# Patient Record
Sex: Female | Born: 1955 | Race: White | Hispanic: No | Marital: Married | Smoking: Never smoker
Health system: Southern US, Community
[De-identification: ages and names within clinical notes are randomized; demographics above are authoritative.]

## PROBLEM LIST (undated history)

## (undated) DIAGNOSIS — I10 Essential (primary) hypertension: Secondary | ICD-10-CM

---

## 2018-02-16 ENCOUNTER — Emergency Department (HOSPITAL_COMMUNITY): Payer: BLUE CROSS/BLUE SHIELD

## 2018-02-16 ENCOUNTER — Encounter (HOSPITAL_COMMUNITY): Payer: Self-pay | Admitting: Emergency Medicine

## 2018-02-16 ENCOUNTER — Inpatient Hospital Stay (HOSPITAL_COMMUNITY)
Admission: EM | Admit: 2018-02-16 | Discharge: 2018-02-22 | DRG: 354 | Disposition: A | Discharge status: Home / Self Care | Payer: BLUE CROSS/BLUE SHIELD | Attending: General Surgery | Admitting: General Surgery

## 2018-02-16 DIAGNOSIS — K439 Ventral hernia without obstruction or gangrene: Secondary | ICD-10-CM | POA: Diagnosis not present

## 2018-02-16 DIAGNOSIS — K460 Unspecified abdominal hernia with obstruction, without gangrene: Secondary | ICD-10-CM

## 2018-02-16 DIAGNOSIS — I864 Gastric varices: Secondary | ICD-10-CM | POA: Diagnosis present

## 2018-02-16 DIAGNOSIS — F419 Anxiety disorder, unspecified: Secondary | ICD-10-CM | POA: Diagnosis present

## 2018-02-16 DIAGNOSIS — K529 Noninfective gastroenteritis and colitis, unspecified: Secondary | ICD-10-CM | POA: Diagnosis present

## 2018-02-16 DIAGNOSIS — I8500 Esophageal varices without bleeding: Secondary | ICD-10-CM

## 2018-02-16 DIAGNOSIS — R112 Nausea with vomiting, unspecified: Secondary | ICD-10-CM | POA: Diagnosis not present

## 2018-02-16 DIAGNOSIS — K5900 Constipation, unspecified: Secondary | ICD-10-CM | POA: Diagnosis present

## 2018-02-16 DIAGNOSIS — R109 Unspecified abdominal pain: Secondary | ICD-10-CM

## 2018-02-16 DIAGNOSIS — Z9889 Other specified postprocedural states: Secondary | ICD-10-CM

## 2018-02-16 DIAGNOSIS — F909 Attention-deficit hyperactivity disorder, unspecified type: Secondary | ICD-10-CM | POA: Diagnosis present

## 2018-02-16 DIAGNOSIS — I10 Essential (primary) hypertension: Secondary | ICD-10-CM | POA: Diagnosis present

## 2018-02-16 DIAGNOSIS — R188 Other ascites: Secondary | ICD-10-CM | POA: Diagnosis present

## 2018-02-16 DIAGNOSIS — K7460 Unspecified cirrhosis of liver: Secondary | ICD-10-CM | POA: Diagnosis present

## 2018-02-16 DIAGNOSIS — Z8719 Personal history of other diseases of the digestive system: Secondary | ICD-10-CM

## 2018-02-16 DIAGNOSIS — R011 Cardiac murmur, unspecified: Secondary | ICD-10-CM | POA: Diagnosis present

## 2018-02-16 HISTORY — DX: Essential (primary) hypertension: I10

## 2018-02-16 LAB — COMPREHENSIVE METABOLIC PANEL
ALBUMIN: 4 g/dL (ref 3.5–5.0)
ALT: 25 U/L (ref 14–54)
AST: 35 U/L (ref 15–41)
Alkaline Phosphatase: 126 U/L (ref 38–126)
Anion gap: 12 (ref 5–15)
BUN: 11 mg/dL (ref 6–20)
CHLORIDE: 97 mmol/L — AB (ref 101–111)
CO2: 26 mmol/L (ref 22–32)
CREATININE: 0.71 mg/dL (ref 0.44–1.00)
Calcium: 9.6 mg/dL (ref 8.9–10.3)
GFR calc Af Amer: >60 mL/min (ref >60)
GLUCOSE: 126 mg/dL — AB (ref 65–99)
Potassium: 3.9 mmol/L (ref 3.5–5.1)
Sodium: 135 mmol/L (ref 135–145)
Total Bilirubin: 1.6 mg/dL — ABNORMAL HIGH (ref 0.3–1.2)
Total Protein: 7.9 g/dL (ref 6.5–8.1)

## 2018-02-16 LAB — LIPASE, BLOOD: LIPASE: 31 U/L (ref 11–51)

## 2018-02-16 LAB — URINALYSIS, ROUTINE W REFLEX MICROSCOPIC
GLUCOSE, UA: NEGATIVE mg/dL
HGB URINE DIPSTICK: NEGATIVE
KETONES UR: NEGATIVE mg/dL
LEUKOCYTES UA: NEGATIVE
Nitrite: NEGATIVE
Protein, ur: NEGATIVE mg/dL
Specific Gravity, Urine: 1.027 (ref 1.005–1.030)
pH: 6 (ref 5.0–8.0)

## 2018-02-16 LAB — CBC
HEMATOCRIT: 40.5 % (ref 36.0–46.0)
Hemoglobin: 13.8 g/dL (ref 12.0–15.0)
MCH: 31.4 pg (ref 26.0–34.0)
MCHC: 34.1 g/dL (ref 30.0–36.0)
MCV: 92 fL (ref 78.0–100.0)
PLATELETS: 126 10*3/uL — AB (ref 150–400)
RBC: 4.4 MIL/uL (ref 3.87–5.11)
RDW: 14.1 % (ref 11.5–15.5)
WBC: 5.5 10*3/uL (ref 4.0–10.5)

## 2018-02-16 LAB — I-STAT BETA HCG BLOOD, ED (MC, WL, AP ONLY): I-stat hCG, quantitative: <5 m[IU]/mL (ref <5)

## 2018-02-16 MED ORDER — IOPAMIDOL (ISOVUE-300) INJECTION 61%
100.0000 mL | Freq: Once | INTRAVENOUS | Status: AC | PRN
  Administered 2018-02-16: 100 mL via INTRAVENOUS

## 2018-02-16 MED ORDER — SODIUM CHLORIDE 0.9 % IV BOLUS
1000.0000 mL | Freq: Once | INTRAVENOUS | Status: AC
  Administered 2018-02-16: 1000 mL via INTRAVENOUS

## 2018-02-16 MED ORDER — IOPAMIDOL (ISOVUE-300) INJECTION 61%
INTRAVENOUS | Status: AC
  Administered 2018-02-16
  Filled 2018-02-16: qty 100

## 2018-02-16 MED ORDER — MORPHINE SULFATE (PF) 4 MG/ML IV SOLN
4.0000 mg | Freq: Once | INTRAVENOUS | Status: AC
  Administered 2018-02-16: 4 mg via INTRAVENOUS
  Filled 2018-02-16: qty 1

## 2018-02-16 NOTE — ED Notes (Signed)
Bed: WA01 Expected date:  Expected time:  Means of arrival:  Comments: 

## 2018-02-16 NOTE — ED Provider Notes (Signed)
[facility] DEPT Provider Note   CSN: [id number] Arrival date & time: 02/16/18  1723     History   Chief Complaint Chief Complaint  Patient presents with  . Abdominal Pain  . Emesis    HPI Suzanne Barajas is a 62 y.o. female.  HPI   Patient is a 62 year old female with history of umbilical hernia who presents the ED today complaining of abdominal pain that has been ongoing for the last week but has been worsening and became unbearable today.  Rates pain 8/10 and constant in nature.  States it radiates from her umbilical area to her bilateral flanks.  States she is not sure if her hernia has been able to be reduced in the past so she has not tried to do so.  She also endorses nausea and vomiting for the last 2 days.  Denies any hematemesis.  Denies any documented fevers.  Denies any urinary symptoms, or diarrhea.  No vaginal symptoms.  Does endorse constipation states she has not had a bowel movement in 3-4 days.  Has been taking lactulose to help her have bowel movement.  She has been able to tolerate p.o. today.  Patient states she was supposed to be scheduled for hernia repair later this month however it was recently rescheduled.  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  States she has not had hydrocodone or alcohol in 2 weeks.  Past Medical History:  Diagnosis Date  . Hypertension     There are no active problems to display for this patient.   OB History   None      Home Medications    Prior to Admission medications   Medication Sig Start Date End Date Taking? Authorizing Provider  amphetamine-dextroamphetamine (ADDERALL) 30 MG tablet Take 30 mg by mouth daily.  01/29/18  Yes [provider]  HYDROcodone-acetaminophen (NORCO) 10-325 MG tablet Take 1 tablet by mouth every 6 (six) hours as needed for moderate pain or severe pain.  01/29/18  Yes [provider]  hydrOXYzine (ATARAX/VISTARIL) 25 MG tablet  Take 25 mg by mouth every 6 (six) hours as needed for anxiety or itching.  01/29/18  Yes [provider]  isosorbide mononitrate (IMDUR) 30 MG 24 hr tablet Take 30 mg by mouth daily. 01/19/18  Yes [provider]  lactulose (CHRONULAC) 10 GM/15ML solution Take 10 g by mouth 2 (two) times daily as needed for mild constipation.   Yes [provider]  carvedilol (COREG) 3.125 MG tablet Take 3.125 mg by mouth daily.  01/19/18   [provider]    Family History No family history on file.  Social History Social History   Tobacco Use  . Smoking status: Not on file  Substance Use Topics  . Alcohol use: Not on file  . Drug use: Not on file     Allergies   Patient has no known allergies.   Review of Systems Review of Systems  Constitutional: Negative for fever.  HENT: Negative for ear pain and sore throat.   Eyes: Negative for visual disturbance.  Respiratory: Negative for shortness of breath.   Cardiovascular: Negative for chest pain.  Gastrointestinal: Positive for abdominal pain, constipation, nausea and vomiting. Negative for blood in stool and diarrhea.  Genitourinary: Positive for flank pain. Negative for dysuria, hematuria, pelvic pain, vaginal bleeding and vaginal discharge.  Musculoskeletal: Negative for back pain.  Skin: Negative for rash.  Neurological: Negative for weakness and light-headedness.  All other systems  reviewed and are negative.    Physical Exam Updated Vital Signs BP (!) 177/92   Pulse 86   Temp 98.1 F (36.7 C) (Oral)   Resp 18   SpO2 99%   Physical Exam  Constitutional: She appears well-developed and well-nourished.  Patient appears to be in mild distress.  Nontoxic.  HENT:  Head: Normocephalic and atraumatic.  Mucous members are dry.  Eyes: Conjunctivae are normal. No scleral icterus.  Neck: Neck supple.  Cardiovascular: Normal rate, regular rhythm, normal heart sounds and intact distal pulses.  No murmur  heard. Pulmonary/Chest: Effort normal and breath sounds normal. No respiratory distress. She has no wheezes.  Abdominal: Soft.  Patient has a large 4-5 cm umbilical hernia that is protruding about 3-4 cm.  There is some erythema to the hernia.  She also has tenderness to the hernia into the right upper quadrant.  Her abdomen is soft and she has active bowel sounds.  There is no rebound tenderness or rigidity.  Bilateral CVA tenderness.  Musculoskeletal: She exhibits no edema.  Neurological: She is alert.  Skin: Skin is warm and dry. Capillary refill takes less than 2 seconds.  Psychiatric: She has a normal mood and affect.  Nursing note and vitals reviewed.    ED Treatments / Results  Labs (all labs ordered are listed, but only abnormal results are displayed) Labs Reviewed  COMPREHENSIVE METABOLIC PANEL - Abnormal; Notable for the following components:      Result Value   Chloride 97 (*)    Glucose, Bld 126 (*)    Total Bilirubin 1.6 (*)    All other components within normal limits  CBC - Abnormal; Notable for the following components:   Platelets 126 (*)    All other components within normal limits  URINALYSIS, ROUTINE W REFLEX MICROSCOPIC - Abnormal; Notable for the following components:   Color, Urine AMBER (*)    Bilirubin Urine MODERATE (*)    All other components within normal limits  LIPASE, BLOOD  I-STAT BETA HCG BLOOD, ED ([facility], [facility], [facility] ONLY)  TYPE AND SCREEN    EKG None  Radiology Ct Abdomen Pelvis W Contrast  Result Date: 02/16/2018 CLINICAL DATA:  Acute onset of generalized abdominal pain, nausea and vomiting. EXAM: CT ABDOMEN AND PELVIS WITH CONTRAST TECHNIQUE: Multidetector CT imaging of the abdomen and pelvis was performed using the standard protocol following bolus administration of intravenous contrast. CONTRAST:  11m ISOVUE-300 IOPAMIDOL (ISOVUE-300) INJECTION 61% COMPARISON:  None. FINDINGS: Lower chest: Minimal bibasilar atelectasis is noted. The visualized  portions of the mediastinum are unremarkable. Hepatobiliary: The diffusely nodular contour of the liver is compatible with hepatic cirrhosis. No dominant mass is identified. The gallbladder is slightly thick walled, though this is nonspecific in the presence of trace ascites. The common bile duct is normal in caliber. Pancreas: The pancreas is within normal limits. Spleen: The spleen is unremarkable in appearance. Adrenals/Urinary Tract: The adrenal glands are unremarkable in appearance. 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 The left kidney is unremarkable in appearance. There is no evidence of hydronephrosis. No renal or ureteral stones are identified. No perinephric stranding is seen. Stomach/Bowel: The stomach is unremarkable in appearance. Large gastric and esophageal varices are noted. There is wall thickening along the distal ileum both proximal and distal to a short herniated segment within the patient'Barajas periumbilical hernia, concerning for an acute infectious or inflammatory ileitis. This is not causing obstruction, though fluid is seen filling the moderate hernia. Given its  size and surrounding fluid, this appears to be incarcerated. Mild associated mesenteric edema is noted. The remainder of the small bowel is grossly unremarkable. The appendix is normal in caliber, without evidence of appendicitis. The colon is unremarkable in appearance. Vascular/Lymphatic: The abdominal aorta is unremarkable in appearance. The inferior vena cava is grossly unremarkable. No retroperitoneal lymphadenopathy is seen. No pelvic sidewall lymphadenopathy is identified. Reproductive: The bladder is mildly distended and grossly unremarkable. The uterus is grossly unremarkable in appearance. The ovaries are grossly symmetric. No suspicious adnexal masses are seen. Other: Trace ascites is noted about the liver and spleen. Musculoskeletal: No acute osseous abnormalities are identified. The visualized  musculature is unremarkable in appearance. IMPRESSION: 1. Wall thickening along the distal ileum both proximal and distal to a focal herniated ileal segment within the patient'Barajas periumbilical hernia. This is concerning for an acute infectious or inflammatory ileitis. No evidence of obstruction. Fluid noted filling the moderate periumbilical hernia. Given the size of the herniated segment and surrounding fluid, this appears to be incarcerated. Mild associated mesenteric edema noted. 2. Findings of hepatic cirrhosis. Large gastric and esophageal varices noted. Trace ascites seen within the upper abdomen. Electronically Signed   By: Garald Balding M.D.   On: 02/16/2018 23:28    Procedures Procedures (including critical care time)  Medications Ordered in ED Medications  piperacillin-tazobactam (ZOSYN) IVPB 3.375 g (has no administration in time range)  sodium chloride 0.9 % bolus 1,000 mL (0 mLs Intravenous Stopped 02/16/18 2310)  morphine 4 MG/ML injection 4 mg (4 mg Intravenous Given 02/16/18 2240)  iopamidol (ISOVUE-300) 61 % injection 100 mL (100 mLs Intravenous Contrast Given 02/16/18 2257)  iopamidol (ISOVUE-300) 61 % injection (  Contrast Given 02/16/18 2343)     Initial Impression / Assessment and Plan / ED Course  I have reviewed the triage vital signs and the nursing notes.  Pertinent labs & imaging results that were available during my care of the patient were reviewed by me and considered in my medical decision making (see chart for details).   Attempted to reduce patient'Barajas hernia and was unsuccessful.  Dr. Tomi Bamberger also saw the patient and attempted to reduce hernia and hernia was unable to be reduced.  CONSULT with Dr. Marlou Starks from general surgery who will evaluate the patient.  Final Clinical Impressions(Barajas) / ED Diagnoses   Final diagnoses:  Incarcerated hernia  Ileitis  Gastric varices  Esophageal varices without bleeding, unspecified esophageal varices type ([place])   Patient with a  history of umbilical hernia presenting with umbilical hernia pain, nausea, vomiting.  She is afebrile however she is hypertensive.  Normal heart rate, respiration O2 sats.  Pain treated in the ED as well as IV fluids given.  Abdominal exam with large umbilical hernia that is not reducible.  Abdomen is soft and there are active bowel sounds.  No rigidity or rebound tenderness.  Labs with normal lipase and no leukocytosis.  No gross electrolyte abnormalities.  Normal creatinine and liver function.  Elevated alk phos to 1.6.  UA with moderate bilirubin.  Pregnancy test negative.  CT abdomen pelvis with IV contracts showed acute infectious versus inflammatory ileitis as well as likely incarcerated umbilical hernia with associated mesenteric edema.  Also noted cirrhosis, ascites, gastric and esophageal varices.    Consult with general surgery given incarcerated hernia and they will admit the patient to their service.  Initiated Zosyn per general surgery recommendation.  ED Discharge Orders    None       Suzanne Barajas,  Suzanne S, PA-C 02/17/18 0050    Dorie Rank, MD 02/17/18 [phone]

## 2018-02-16 NOTE — ED Triage Notes (Addendum)
[facility] with n/v all week. Has umbilical hernia and was supposed to have surgery back in March but didn't. No BM in 3 days even taking Lactulose. [facility].

## 2018-02-17 ENCOUNTER — Other Ambulatory Visit: Payer: Self-pay

## 2018-02-17 ENCOUNTER — Encounter (HOSPITAL_COMMUNITY): Payer: Self-pay | Admitting: General Surgery

## 2018-02-17 DIAGNOSIS — R011 Cardiac murmur, unspecified: Secondary | ICD-10-CM | POA: Diagnosis present

## 2018-02-17 DIAGNOSIS — F419 Anxiety disorder, unspecified: Secondary | ICD-10-CM | POA: Diagnosis present

## 2018-02-17 DIAGNOSIS — K439 Ventral hernia without obstruction or gangrene: Secondary | ICD-10-CM | POA: Diagnosis present

## 2018-02-17 DIAGNOSIS — R188 Other ascites: Secondary | ICD-10-CM | POA: Diagnosis present

## 2018-02-17 DIAGNOSIS — I10 Essential (primary) hypertension: Secondary | ICD-10-CM | POA: Diagnosis present

## 2018-02-17 DIAGNOSIS — K529 Noninfective gastroenteritis and colitis, unspecified: Secondary | ICD-10-CM | POA: Diagnosis present

## 2018-02-17 DIAGNOSIS — K7460 Unspecified cirrhosis of liver: Secondary | ICD-10-CM | POA: Diagnosis present

## 2018-02-17 DIAGNOSIS — K5900 Constipation, unspecified: Secondary | ICD-10-CM | POA: Diagnosis present

## 2018-02-17 DIAGNOSIS — F909 Attention-deficit hyperactivity disorder, unspecified type: Secondary | ICD-10-CM | POA: Diagnosis present

## 2018-02-17 DIAGNOSIS — R112 Nausea with vomiting, unspecified: Secondary | ICD-10-CM | POA: Diagnosis present

## 2018-02-17 DIAGNOSIS — I864 Gastric varices: Secondary | ICD-10-CM | POA: Diagnosis present

## 2018-02-17 DIAGNOSIS — I8500 Esophageal varices without bleeding: Secondary | ICD-10-CM | POA: Diagnosis present

## 2018-02-17 LAB — CBC WITH DIFFERENTIAL/PLATELET
BASOS ABS: 0 10*3/uL (ref 0.0–0.1)
Basophils Relative: 0 %
EOS ABS: 0.1 10*3/uL (ref 0.0–0.7)
Eosinophils Relative: 2 %
HCT: 34.9 % — ABNORMAL LOW (ref 36.0–46.0)
HEMOGLOBIN: 11.8 g/dL — AB (ref 12.0–15.0)
LYMPHS ABS: 1.3 10*3/uL (ref 0.7–4.0)
Lymphocytes Relative: 22 %
MCH: 31.3 pg (ref 26.0–34.0)
MCHC: 33.8 g/dL (ref 30.0–36.0)
MCV: 92.6 fL (ref 78.0–100.0)
Monocytes Absolute: 0.7 10*3/uL (ref 0.1–1.0)
Monocytes Relative: 12 %
Neutro Abs: 3.8 10*3/uL (ref 1.7–7.7)
Neutrophils Relative %: 64 %
Platelets: 115 10*3/uL — ABNORMAL LOW (ref 150–400)
RBC: 3.77 MIL/uL — AB (ref 3.87–5.11)
RDW: 14.4 % (ref 11.5–15.5)
WBC: 5.9 10*3/uL (ref 4.0–10.5)

## 2018-02-17 LAB — PROTIME-INR
INR: 1.21
PROTHROMBIN TIME: 15.2 s (ref 11.4–15.2)

## 2018-02-17 LAB — TYPE AND SCREEN
ABO/RH(D): O POS
Antibody Screen: POSITIVE
PT AG Type: NEGATIVE

## 2018-02-17 LAB — HIV ANTIBODY (ROUTINE TESTING W REFLEX): HIV SCREEN 4TH GENERATION: NONREACTIVE

## 2018-02-17 MED ORDER — LACTULOSE 10 GM/15ML PO SOLN
10.0000 g | Freq: Two times a day (BID) | ORAL | Status: DC | PRN
  Administered 2018-02-18: 10 g via ORAL
  Filled 2018-02-17: qty 15

## 2018-02-17 MED ORDER — ISOSORBIDE MONONITRATE ER 30 MG PO TB24
30.0000 mg | ORAL_TABLET | Freq: Every day | ORAL | Status: DC
  Administered 2018-02-17 – 2018-02-19 (×2): 30 mg via ORAL
  Filled 2018-02-17 (×3): qty 1

## 2018-02-17 MED ORDER — HYDROXYZINE HCL 25 MG PO TABS
25.0000 mg | ORAL_TABLET | Freq: Four times a day (QID) | ORAL | Status: DC | PRN
  Administered 2018-02-17 – 2018-02-18 (×3): 25 mg via ORAL
  Filled 2018-02-17 (×3): qty 1

## 2018-02-17 MED ORDER — KCL IN DEXTROSE-NACL 20-5-0.9 MEQ/L-%-% IV SOLN
INTRAVENOUS | Status: DC
  Administered 2018-02-17 – 2018-02-19 (×3): via INTRAVENOUS
  Filled 2018-02-17 (×5): qty 1000

## 2018-02-17 MED ORDER — AMPHETAMINE-DEXTROAMPHETAMINE 10 MG PO TABS
30.0000 mg | ORAL_TABLET | Freq: Every day | ORAL | Status: DC
  Administered 2018-02-17 – 2018-02-19 (×3): 30 mg via ORAL
  Filled 2018-02-17 (×3): qty 3

## 2018-02-17 MED ORDER — MORPHINE SULFATE (PF) 2 MG/ML IV SOLN
1.0000 mg | INTRAVENOUS | Status: DC | PRN
  Administered 2018-02-17: 1 mg via INTRAVENOUS
  Administered 2018-02-17 – 2018-02-19 (×7): 2 mg via INTRAVENOUS
  Filled 2018-02-17 (×9): qty 1

## 2018-02-17 MED ORDER — DIPHENHYDRAMINE HCL 50 MG/ML IJ SOLN: 25.0000 mg | Freq: Four times a day (QID) | INTRAMUSCULAR | Status: DC | PRN

## 2018-02-17 MED ORDER — SODIUM CHLORIDE 0.9 % IV BOLUS
500.0000 mL | Freq: Once | INTRAVENOUS | Status: AC
  Administered 2018-02-17: 500 mL via INTRAVENOUS

## 2018-02-17 MED ORDER — DIPHENHYDRAMINE HCL 25 MG PO CAPS: 25.0000 mg | ORAL_CAPSULE | Freq: Four times a day (QID) | ORAL | Status: DC | PRN

## 2018-02-17 MED ORDER — ONDANSETRON HCL 4 MG/2ML IJ SOLN: 4.0000 mg | Freq: Four times a day (QID) | INTRAMUSCULAR | Status: DC | PRN

## 2018-02-17 MED ORDER — CARVEDILOL 3.125 MG PO TABS
3.1250 mg | ORAL_TABLET | Freq: Every day | ORAL | Status: DC
  Administered 2018-02-17 – 2018-02-19 (×2): 3.125 mg via ORAL
  Filled 2018-02-17 (×3): qty 1

## 2018-02-17 MED ORDER — ONDANSETRON 4 MG PO TBDP: 4.0000 mg | ORAL_TABLET | Freq: Four times a day (QID) | ORAL | Status: DC | PRN

## 2018-02-17 MED ORDER — PIPERACILLIN-TAZOBACTAM 3.375 G IVPB 30 MIN
3.3750 g | INTRAVENOUS | Status: AC
  Administered 2018-02-17: 3.375 g via INTRAVENOUS
  Filled 2018-02-17: qty 50

## 2018-02-17 MED ORDER — FAMOTIDINE IN NACL 20-0.9 MG/50ML-% IV SOLN
20.0000 mg | Freq: Two times a day (BID) | INTRAVENOUS | Status: DC
  Administered 2018-02-17 – 2018-02-19 (×6): 20 mg via INTRAVENOUS
  Filled 2018-02-17 (×6): qty 50

## 2018-02-17 NOTE — ED Notes (Signed)
ED TO INPATIENT HANDOFF REPORT  Name/Age/Gender Suzanne Barajas 62 y.o. female  Code Status   Home/SNF/Other Home  Chief Complaint Abd Pain; Nausea; Emesis  Level of Care/Admitting Diagnosis ED Disposition    ED Disposition Condition Comment   Admit  Hospital Area: [facility] [[id number]]  Level of Care: Med-Surg [16]  Diagnosis: Ventral hernia without obstruction or gangrene [427062]  Admitting Physician: Jovita Kussmaul [phone]  Attending Physician: CCS, MD [[id number]]  Estimated length of stay: 3 - 4 days  Certification:: I certify this patient will need inpatient services for at least 2 midnights  PT Class (Do Not Modify): Inpatient [101]  PT Acc Code (Do Not Modify): Private [1]       Medical History Past Medical History:  Diagnosis Date  . Hypertension     Allergies No Known Allergies  IV Location/Drains/Wounds Patient Lines/Drains/Airways Status   Active Line/Drains/Airways    Name:   Placement date:   Placement time:   Site:   Days:   Peripheral IV 02/16/18 Left Hand   02/16/18    2230    Hand   1          Labs/Imaging Results for orders placed or performed during the hospital encounter of 02/16/18 (from the past 48 hour(s))  Lipase, blood     Status: None   Collection Time: 02/16/18  5:34 PM  Result Value Ref Range   Lipase 31 11 - 51 U/L    Comment: Performed at [facility], [place] [address], [place], [place] [address]  Comprehensive metabolic panel     Status: Abnormal   Collection Time: 02/16/18  5:34 PM  Result Value Ref Range   Sodium 135 135 - 145 mmol/L   Potassium 3.9 3.5 - 5.1 mmol/L   Chloride 97 (L) 101 - 111 mmol/L   CO2 26 22 - 32 mmol/L   Glucose, Bld 126 (H) 65 - 99 mg/dL   BUN 11 6 - 20 mg/dL   Creatinine, Ser 0.71 0.44 - 1.00 mg/dL   Calcium 9.6 8.9 - 10.3 mg/dL   Total Protein 7.9 6.5 - 8.1 g/dL   Albumin 4.0 3.5 - 5.0 g/dL   AST 35 15 - 41 U/L   ALT 25 14 - 54 U/L   Alkaline Phosphatase 126 38  - 126 U/L   Total Bilirubin 1.6 (H) 0.3 - 1.2 mg/dL   GFR calc non Af Amer >60 >60 mL/min   GFR calc Af Amer >60 >60 mL/min    Comment: (NOTE) The eGFR has been calculated using the CKD EPI equation. This calculation has not been validated in all clinical situations. eGFR's persistently <60 mL/min signify possible Chronic Kidney Disease.    Anion gap 12 5 - 15    Comment: Performed at [facility], [place] [address], [place], [place] [address]  CBC     Status: Abnormal   Collection Time: 02/16/18  5:34 PM  Result Value Ref Range   WBC 5.5 4.0 - 10.5 K/uL   RBC 4.40 3.87 - 5.11 MIL/uL   Hemoglobin 13.8 12.0 - 15.0 g/dL   HCT 40.5 36.0 - 46.0 %   MCV 92.0 78.0 - 100.0 fL   MCH 31.4 26.0 - 34.0 pg   MCHC 34.1 30.0 - 36.0 g/dL   RDW 14.1 11.5 - 15.5 %   Platelets 126 (L) 150 - 400 K/uL    Comment: Performed at [facility], [address] [place],  [place] [address]  Urinalysis, Routine w reflex microscopic     Status: Abnormal   Collection Time: 02/16/18  5:36 PM  Result Value Ref Range   Color, Urine AMBER (A) YELLOW    Comment: BIOCHEMICALS MAY BE AFFECTED BY COLOR   APPearance CLEAR CLEAR   Specific Gravity, Urine 1.027 1.005 - 1.030   pH 6.0 5.0 - 8.0   Glucose, UA NEGATIVE NEGATIVE mg/dL   Hgb urine dipstick NEGATIVE NEGATIVE   Bilirubin Urine MODERATE (A) NEGATIVE   Ketones, ur NEGATIVE NEGATIVE mg/dL   Protein, ur NEGATIVE NEGATIVE mg/dL   Nitrite NEGATIVE NEGATIVE   Leukocytes, UA NEGATIVE NEGATIVE    Comment: Performed at [facility], [place] [address], [place], [place] [address]  I-Stat beta hCG blood, ED     Status: None   Collection Time: 02/16/18  5:53 PM  Result Value Ref Range   I-stat hCG, quantitative <5.0 <5 mIU/mL   Comment 3            Comment:   GEST. AGE      CONC.  (mIU/mL)   <=1 WEEK        5 - 50     2 WEEKS       50 - 500     3 WEEKS       100 - 10,000     4 WEEKS     1,000 - 30,000         FEMALE AND NON-PREGNANT FEMALE:     LESS THAN 5 mIU/mL    Ct Abdomen Pelvis W Contrast  Result Date: 02/16/2018 CLINICAL DATA:  Acute onset of generalized abdominal pain, nausea and vomiting. EXAM: CT ABDOMEN AND PELVIS WITH CONTRAST TECHNIQUE: Multidetector CT imaging of the abdomen and pelvis was performed using the standard protocol following bolus administration of intravenous contrast. CONTRAST:  130m ISOVUE-300 IOPAMIDOL (ISOVUE-300) INJECTION 61% COMPARISON:  None. FINDINGS: Lower chest: Minimal bibasilar atelectasis is noted. The visualized portions of the mediastinum are unremarkable. Hepatobiliary: The diffusely nodular contour of the liver is compatible with hepatic cirrhosis. No dominant mass is identified. The gallbladder is slightly thick walled, though this is nonspecific in the presence of trace ascites. The common bile duct is normal in caliber. Pancreas: The pancreas is within normal limits. Spleen: The spleen is unremarkable in appearance. Adrenals/Urinary Tract: The adrenal glands are unremarkable in appearance. 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 The left kidney is unremarkable in appearance. There is no evidence of hydronephrosis. No renal or ureteral stones are identified. No perinephric stranding is seen. Stomach/Bowel: The stomach is unremarkable in appearance. Large gastric and esophageal varices are noted. There is wall thickening along the distal ileum both proximal and distal to a short herniated segment within the patient's periumbilical hernia, concerning for an acute infectious or inflammatory ileitis. This is not causing obstruction, though fluid is seen filling the moderate hernia. Given its size and surrounding fluid, this appears to be incarcerated. Mild associated mesenteric edema is noted. The remainder of the small bowel is grossly unremarkable. The appendix is normal in caliber, without evidence of appendicitis. The colon is unremarkable in  appearance. Vascular/Lymphatic: The abdominal aorta is unremarkable in appearance. The inferior vena cava is grossly unremarkable. No retroperitoneal lymphadenopathy is seen. No pelvic sidewall lymphadenopathy is identified. Reproductive: The bladder is mildly distended and grossly unremarkable. The uterus is grossly unremarkable in appearance. The ovaries are grossly symmetric. No suspicious adnexal masses are seen. Other: Trace  ascites is noted about the liver and spleen. Musculoskeletal: No acute osseous abnormalities are identified. The visualized musculature is unremarkable in appearance. IMPRESSION: 1. Wall thickening along the distal ileum both proximal and distal to a focal herniated ileal segment within the patient's periumbilical hernia. This is concerning for an acute infectious or inflammatory ileitis. No evidence of obstruction. Fluid noted filling the moderate periumbilical hernia. Given the size of the herniated segment and surrounding fluid, this appears to be incarcerated. Mild associated mesenteric edema noted. 2. Findings of hepatic cirrhosis. Large gastric and esophageal varices noted. Trace ascites seen within the upper abdomen. Electronically Signed   By: Garald Balding M.D.   On: 02/16/2018 23:28    Pending Labs Unresulted Labs (From admission, onward)   Start     Ordered   02/17/18 0500  CBC with Differential/Platelet  Tomorrow morning,   R     02/17/18 0154   02/17/18 0050  Type and screen [place]  Once,   STAT    Comments:  [place]    02/17/18 0049   Signed and Held  HIV antibody (Routine Testing)  Once,   R     Signed and Held   Signed and Held  Protime-INR  Tomorrow morning,   R     Signed and Held      Vitals/Pain Today's Vitals   02/16/18 2158 02/16/18 2230 02/17/18 0038 02/17/18 0249  BP: (!) 170/94 (!) 177/92    Pulse: 85 86    Resp: 20 18    Temp:      TempSrc:      SpO2: 100% 99%    PainSc:   5  5      Isolation Precautions No active isolations  Medications Medications  sodium chloride 0.9 % bolus 1,000 mL (0 mLs Intravenous Stopped 02/16/18 2310)  morphine 4 MG/ML injection 4 mg (4 mg Intravenous Given 02/16/18 2240)  iopamidol (ISOVUE-300) 61 % injection 100 mL (100 mLs Intravenous Contrast Given 02/16/18 2257)  iopamidol (ISOVUE-300) 61 % injection (  Contrast Given 02/16/18 2343)  piperacillin-tazobactam (ZOSYN) IVPB 3.375 g (0 g Intravenous Stopped 02/17/18 0127)    Mobility walks

## 2018-02-17 NOTE — H&P (Signed)
Suzanne Barajas is an 62 y.o. female.   Chief Complaint: abdominal pain HPI: The patient is a 62 year old white female who presents with abdominal pain.  The pain started last Sunday.  She has had a known umbilical hernia for at least a couple years.  She has a history of surgery for ulcer disease on her stomach that was done in [place] about 2 or 3 years ago.  She has had a couple episodes of nausea and vomiting in the last week.  The pain persisted so she came to the emergency department.  A CT scan shows a ventral hernia with some surrounding edema suggesting incarceration.  Past Medical History:  Diagnosis Date  . Hypertension     No family history on file. Social History:  has no tobacco, alcohol, and drug history on file.  Allergies: No Known Allergies   (Not in a hospital admission)  Results for orders placed or performed during the hospital encounter of 02/16/18 (from the past 48 hour(s))  Lipase, blood     Status: None   Collection Time: 02/16/18  5:34 PM  Result Value Ref Range   Lipase 31 11 - 51 U/L    Comment: Performed at [facility], [place] [address] [place], [place] [address]  Comprehensive metabolic panel     Status: Abnormal   Collection Time: 02/16/18  5:34 PM  Result Value Ref Range   Sodium 135 135 - 145 mmol/L   Potassium 3.9 3.5 - 5.1 mmol/L   Chloride 97 (L) 101 - 111 mmol/L   CO2 26 22 - 32 mmol/L   Glucose, Bld 126 (H) 65 - 99 mg/dL   BUN 11 6 - 20 mg/dL   Creatinine, Ser 0.71 0.44 - 1.00 mg/dL   Calcium 9.6 8.9 - 10.3 mg/dL   Total Protein 7.9 6.5 - 8.1 g/dL   Albumin 4.0 3.5 - 5.0 g/dL   AST 35 15 - 41 U/L   ALT 25 14 - 54 U/L   Alkaline Phosphatase 126 38 - 126 U/L   Total Bilirubin 1.6 (H) 0.3 - 1.2 mg/dL   GFR calc non Af Amer >60 >60 mL/min   GFR calc Af Amer >60 >60 mL/min    Comment: (NOTE) The eGFR has been calculated using the CKD EPI equation. This calculation has not been validated in all clinical  situations. eGFR's persistently <60 mL/min signify possible Chronic Kidney Disease.    Anion gap 12 5 - 15    Comment: Performed at [facility], [place] [address], [place], [place] [address]  CBC     Status: Abnormal   Collection Time: 02/16/18  5:34 PM  Result Value Ref Range   WBC 5.5 4.0 - 10.5 K/uL   RBC 4.40 3.87 - 5.11 MIL/uL   Hemoglobin 13.8 12.0 - 15.0 g/dL   HCT 40.5 36.0 - 46.0 %   MCV 92.0 78.0 - 100.0 fL   MCH 31.4 26.0 - 34.0 pg   MCHC 34.1 30.0 - 36.0 g/dL   RDW 14.1 11.5 - 15.5 %   Platelets 126 (L) 150 - 400 K/uL    Comment: Performed at [facility], [place] [address] [place], [place] [address]  Urinalysis, Routine w reflex microscopic     Status: Abnormal   Collection Time: 02/16/18  5:36 PM  Result Value Ref Range   Color, Urine AMBER (A) YELLOW    Comment: BIOCHEMICALS MAY BE AFFECTED BY COLOR   APPearance CLEAR CLEAR  Specific Gravity, Urine 1.027 1.005 - 1.030   pH 6.0 5.0 - 8.0   Glucose, UA NEGATIVE NEGATIVE mg/dL   Hgb urine dipstick NEGATIVE NEGATIVE   Bilirubin Urine MODERATE (A) NEGATIVE   Ketones, ur NEGATIVE NEGATIVE mg/dL   Protein, ur NEGATIVE NEGATIVE mg/dL   Nitrite NEGATIVE NEGATIVE   Leukocytes, UA NEGATIVE NEGATIVE    Comment: Performed at [place] [address] [place], [place] [address]  I-Stat beta hCG blood, ED     Status: None   Collection Time: 02/16/18  5:53 PM  Result Value Ref Range   I-stat hCG, quantitative <5.0 <5 mIU/mL   Comment 3            Comment:   GEST. AGE      CONC.  (mIU/mL)   <=1 WEEK        5 - 50     2 WEEKS       50 - 500     3 WEEKS       100 - 10,000     4 WEEKS     1,000 - 30,000        FEMALE AND NON-PREGNANT FEMALE:     LESS THAN 5 mIU/mL    Ct Abdomen Pelvis W Contrast  Result Date: 02/16/2018 CLINICAL DATA:  Acute onset of generalized abdominal pain, nausea and vomiting. EXAM: CT ABDOMEN AND PELVIS WITH CONTRAST TECHNIQUE: Multidetector  CT imaging of the abdomen and pelvis was performed using the standard protocol following bolus administration of intravenous contrast. CONTRAST:  191m ISOVUE-300 IOPAMIDOL (ISOVUE-300) INJECTION 61% COMPARISON:  None. FINDINGS: Lower chest: Minimal bibasilar atelectasis is noted. The visualized portions of the mediastinum are unremarkable. Hepatobiliary: The diffusely nodular contour of the liver is compatible with hepatic cirrhosis. No dominant mass is identified. The gallbladder is slightly thick walled, though this is nonspecific in the presence of trace ascites. The common bile duct is normal in caliber. Pancreas: The pancreas is within normal limits. Spleen: The spleen is unremarkable in appearance. Adrenals/Urinary Tract: The adrenal glands are unremarkable in appearance. 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 The left kidney is unremarkable in appearance. There is no evidence of hydronephrosis. No renal or ureteral stones are identified. No perinephric stranding is seen. Stomach/Bowel: The stomach is unremarkable in appearance. Large gastric and esophageal varices are noted. There is wall thickening along the distal ileum both proximal and distal to a short herniated segment within the patient's periumbilical hernia, concerning for an acute infectious or inflammatory ileitis. This is not causing obstruction, though fluid is seen filling the moderate hernia. Given its size and surrounding fluid, this appears to be incarcerated. Mild associated mesenteric edema is noted. The remainder of the small bowel is grossly unremarkable. The appendix is normal in caliber, without evidence of appendicitis. The colon is unremarkable in appearance. Vascular/Lymphatic: The abdominal aorta is unremarkable in appearance. The inferior vena cava is grossly unremarkable. No retroperitoneal lymphadenopathy is seen. No pelvic sidewall lymphadenopathy is identified. Reproductive: The bladder is mildly distended  and grossly unremarkable. The uterus is grossly unremarkable in appearance. The ovaries are grossly symmetric. No suspicious adnexal masses are seen. Other: Trace ascites is noted about the liver and spleen. Musculoskeletal: No acute osseous abnormalities are identified. The visualized musculature is unremarkable in appearance. IMPRESSION: 1. Wall thickening along the distal ileum both proximal and distal to a focal herniated ileal segment within the patient's periumbilical hernia. This is concerning for an acute  infectious or inflammatory ileitis. No evidence of obstruction. Fluid noted filling the moderate periumbilical hernia. Given the size of the herniated segment and surrounding fluid, this appears to be incarcerated. Mild associated mesenteric edema noted. 2. Findings of hepatic cirrhosis. Large gastric and esophageal varices noted. Trace ascites seen within the upper abdomen. Electronically Signed   By: Garald Balding M.D.   On: 02/16/2018 23:28    Review of Systems  Constitutional: Negative.   HENT: Negative.   Eyes: Negative.   Respiratory: Negative.   Cardiovascular: Negative.   Gastrointestinal: Positive for abdominal pain, nausea and vomiting.  Genitourinary: Negative.   Musculoskeletal: Negative.   Skin: Negative.   Neurological: Negative.   Endo/Heme/Allergies: Negative.   Psychiatric/Behavioral: Negative.     Blood pressure (!) 177/92, pulse 86, temperature 98.1 F (36.7 C), temperature source Oral, resp. rate 18, SpO2 99 %. Physical Exam  Constitutional: She is oriented to person, place, and time. She appears well-developed and well-nourished. No distress.  HENT:  Head: Normocephalic and atraumatic.  Mouth/Throat: No oropharyngeal exudate.  Eyes: Pupils are equal, round, and reactive to light. Conjunctivae and EOM are normal.  Neck: Normal range of motion. Neck supple.  Cardiovascular: Normal rate, regular rhythm and normal heart sounds.  Respiratory: Effort normal and  breath sounds normal. No stridor. No respiratory distress.  GI: Soft. Bowel sounds are normal. She exhibits no distension. There is tenderness.  Easily reducible umbilical hernia  Musculoskeletal: Normal range of motion. She exhibits no edema or tenderness.  Neurological: She is alert and oriented to person, place, and time. Coordination normal.  Skin: Skin is warm and dry. No rash noted.  Psychiatric: She has a normal mood and affect. Her behavior is normal. Thought content normal.     Assessment/Plan The patient appears to have a reducible ventral umbilical hernia.  Because it has become symptomatic for I think it would be reasonable to fix this during this admission.  Since the hernia reduced so easily I would probably wait a day or 2 to let the bowel edema resolved.  She could then have a ventral hernia repair with mesh safely with minimal risk of infection.  I have discussed this with her and she is in agreement with this treatment plan.  Merrie Roof, MD 02/17/2018, 1:41 AM

## 2018-02-17 NOTE — Progress Notes (Signed)
A consult was received from an ED physician for zosyn per pharmacy dosing.  The patient's profile has been reviewed for ht/wt/allergies/indication/available labs.   A one time order has been placed for Zosyn 3.375 Gm.  Further antibiotics/pharmacy consults should be ordered by admitting physician if indicated.                       Thank you, Lorenza EvangelistGreen, Caterine Mcmeans R 02/17/2018  12:51 AM

## 2018-02-17 NOTE — ED Provider Notes (Signed)
Medical screening examination/treatment/procedure(s) were conducted as a shared visit with non-physician practitioner(s) and myself.  I personally evaluated the patient during the encounter.  Pt presented to the ED for abdominal pain.  On exam, pt has a large umbilical hernia, that is firm, tender and has some surrounding erythema.  I attempted reduction unsuccessfully.  CT scan demonstrates an incarcerated hernia. Will consult with surgery.   Linwood DibblesKnapp, Lakeeta Dobosz, MD 02/17/18 [phone]

## 2018-02-17 NOTE — Progress Notes (Signed)
Patient had a clonidine patch on the back of her neck from [facility].  Ho clonidine ordered for pt at this time.  Clonidine patch removed, will continue to monitor.

## 2018-02-17 NOTE — Progress Notes (Signed)
Subjective/Chief Complaint: Feels better. Less pain and no nausea   Objective: Vital signs in last 24 hours: Temp:  [98.1 F (36.7 C)-98.9 F (37.2 C)] 98.9 F (37.2 C) (04/20 0332) Pulse Rate:  [85-87] 87 (04/20 0332) Resp:  [18-20] 18 (04/20 0332) BP: (97-182)/(63-97) 97/63 (04/20 0332) SpO2:  [99 %-100 %] 99 % (04/20 0332) Weight:  [61.8 kg (136 lb 3.9 oz)] 61.8 kg (136 lb 3.9 oz) (04/20 0555) Last BM Date: 02/13/18  Intake/Output from previous day: 04/19 0701 - 04/20 0700 In: 1250 [I.V.:150; IV Piggyback:1100] Out: 0  Intake/Output this shift: No intake/output data recorded.  General appearance: alert and cooperative Resp: clear to auscultation bilaterally Cardio: regular rate and rhythm GI: soft, hernia is reduced  Lab Results:  Recent Labs    02/16/18 1734 02/17/18 0331  WBC 5.5 5.9  HGB 13.8 11.8*  HCT 40.5 34.9*  PLT 126* 115*   BMET Recent Labs    02/16/18 1734  NA 135  K 3.9  CL 97*  CO2 26  GLUCOSE 126*  BUN 11  CREATININE 0.71  CALCIUM 9.6   PT/INR Recent Labs    02/17/18 0347  LABPROT 15.2  INR 1.21   ABG No results for input(s): PHART, HCO3 in the last 72 hours.  Invalid input(s): PCO2, PO2  Studies/Results: Ct Abdomen Pelvis W Contrast  Result Date: 02/16/2018 CLINICAL DATA:  Acute onset of generalized abdominal pain, nausea and vomiting. EXAM: CT ABDOMEN AND PELVIS WITH CONTRAST TECHNIQUE: Multidetector CT imaging of the abdomen and pelvis was performed using the standard protocol following bolus administration of intravenous contrast. CONTRAST:  100mL ISOVUE-300 IOPAMIDOL (ISOVUE-300) INJECTION 61% COMPARISON:  None. FINDINGS: Lower chest: Minimal bibasilar atelectasis is noted. The visualized portions of the mediastinum are unremarkable. Hepatobiliary: The diffusely nodular contour of the liver is compatible with hepatic cirrhosis. No dominant mass is identified. The gallbladder is slightly thick walled, though this is  nonspecific in the presence of trace ascites. The common bile duct is normal in caliber. Pancreas: The pancreas is within normal limits. Spleen: The spleen is unremarkable in appearance. Adrenals/Urinary Tract: The adrenal glands are unremarkable in appearance. 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 The left kidney is unremarkable in appearance. There is no evidence of hydronephrosis. No renal or ureteral stones are identified. No perinephric stranding is seen. Stomach/Bowel: The stomach is unremarkable in appearance. Large gastric and esophageal varices are noted. There is wall thickening along the distal ileum both proximal and distal to a short herniated segment within the patient's periumbilical hernia, concerning for an acute infectious or inflammatory ileitis. This is not causing obstruction, though fluid is seen filling the moderate hernia. Given its size and surrounding fluid, this appears to be incarcerated. Mild associated mesenteric edema is noted. The remainder of the small bowel is grossly unremarkable. The appendix is normal in caliber, without evidence of appendicitis. The colon is unremarkable in appearance. Vascular/Lymphatic: The abdominal aorta is unremarkable in appearance. The inferior vena cava is grossly unremarkable. No retroperitoneal lymphadenopathy is seen. No pelvic sidewall lymphadenopathy is identified. Reproductive: The bladder is mildly distended and grossly unremarkable. The uterus is grossly unremarkable in appearance. The ovaries are grossly symmetric. No suspicious adnexal masses are seen. Other: Trace ascites is noted about the liver and spleen. Musculoskeletal: No acute osseous abnormalities are identified. The visualized musculature is unremarkable in appearance. IMPRESSION: 1. Wall thickening along the distal ileum both proximal and distal to a focal herniated ileal segment within the  patient's periumbilical hernia. This is concerning for an acute  infectious or inflammatory ileitis. No evidence of obstruction. Fluid noted filling the moderate periumbilical hernia. Given the size of the herniated segment and surrounding fluid, this appears to be incarcerated. Mild associated mesenteric edema noted. 2. Findings of hepatic cirrhosis. Large gastric and esophageal varices noted. Trace ascites seen within the upper abdomen. Electronically Signed   By: Roanna Raider M.D.   On: 02/16/2018 23:28    Anti-infectives: Anti-infectives (From admission, onward)   Start     Dose/Rate Route Frequency Ordered Stop   02/17/18 0100  piperacillin-tazobactam (ZOSYN) IVPB 3.375 g     3.375 g 100 mL/hr over 30 Minutes Intravenous NOW 02/17/18 0050 02/17/18 0127      Assessment/Plan: s/p * No surgery found * continue clears  Consider definitive hernia repair in the next few days  LOS: 0 days    TOTH III,PAUL S 02/17/2018

## 2018-02-17 NOTE — ED Notes (Signed)
Consult to General surgery @12 :04am.

## 2018-02-18 MED ORDER — MUPIROCIN 2 % EX OINT
1.0000 "application " | TOPICAL_OINTMENT | Freq: Two times a day (BID) | CUTANEOUS | Status: DC
  Administered 2018-02-18 – 2018-02-19 (×2): 1 via NASAL
  Filled 2018-02-18 (×2): qty 22

## 2018-02-18 NOTE — H&P (View-Only) (Signed)
Subjective/Chief Complaint: Complains of abd soreness. No nausea   Objective: Vital signs in last 24 hours: Temp:  [98.2 F (36.8 C)-98.4 F (36.9 C)] 98.2 F (36.8 C) (04/21 0643) Pulse Rate:  [62-81] 62 (04/21 0643) Resp:  [13-18] 13 (04/21 0643) BP: (91-106)/(54-67) 106/67 (04/21 0643) SpO2:  [96 %-98 %] 98 % (04/21 0643) Last BM Date: 02/13/18  Intake/Output from previous day: 04/20 0701 - 04/21 0700 In: 2880 [P.O.:480; I.V.:1800; IV Piggyback:600] Out: -  Intake/Output this shift: No intake/output data recorded.  General appearance: alert and cooperative Resp: clear to auscultation bilaterally Cardio: regular rate and rhythm GI: soft, minimal tenderness. hernia reducible easily  Lab Results:  Recent Labs    02/16/18 1734 02/17/18 0331  WBC 5.5 5.9  HGB 13.8 11.8*  HCT 40.5 34.9*  PLT 126* 115*   BMET Recent Labs    02/16/18 1734  NA 135  K 3.9  CL 97*  CO2 26  GLUCOSE 126*  BUN 11  CREATININE 0.71  CALCIUM 9.6   PT/INR Recent Labs    02/17/18 0347  LABPROT 15.2  INR 1.21   ABG No results for input(s): PHART, HCO3 in the last 72 hours.  Invalid input(s): PCO2, PO2  Studies/Results: Ct Abdomen Pelvis W Contrast  Result Date: 02/16/2018 CLINICAL DATA:  Acute onset of generalized abdominal pain, nausea and vomiting. EXAM: CT ABDOMEN AND PELVIS WITH CONTRAST TECHNIQUE: Multidetector CT imaging of the abdomen and pelvis was performed using the standard protocol following bolus administration of intravenous contrast. CONTRAST:  100mL ISOVUE-300 IOPAMIDOL (ISOVUE-300) INJECTION 61% COMPARISON:  None. FINDINGS: Lower chest: Minimal bibasilar atelectasis is noted. The visualized portions of the mediastinum are unremarkable. Hepatobiliary: The diffusely nodular contour of the liver is compatible with hepatic cirrhosis. No dominant mass is identified. The gallbladder is slightly thick walled, though this is nonspecific in the presence of trace ascites.  The common bile duct is normal in caliber. Pancreas: The pancreas is within normal limits. Spleen: The spleen is unremarkable in appearance. Adrenals/Urinary Tract: The adrenal glands are unremarkable in appearance. 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 The left kidney is unremarkable in appearance. There is no evidence of hydronephrosis. No renal or ureteral stones are identified. No perinephric stranding is seen. Stomach/Bowel: The stomach is unremarkable in appearance. Large gastric and esophageal varices are noted. There is wall thickening along the distal ileum both proximal and distal to a short herniated segment within the patient's periumbilical hernia, concerning for an acute infectious or inflammatory ileitis. This is not causing obstruction, though fluid is seen filling the moderate hernia. Given its size and surrounding fluid, this appears to be incarcerated. Mild associated mesenteric edema is noted. The remainder of the small bowel is grossly unremarkable. The appendix is normal in caliber, without evidence of appendicitis. The colon is unremarkable in appearance. Vascular/Lymphatic: The abdominal aorta is unremarkable in appearance. The inferior vena cava is grossly unremarkable. No retroperitoneal lymphadenopathy is seen. No pelvic sidewall lymphadenopathy is identified. Reproductive: The bladder is mildly distended and grossly unremarkable. The uterus is grossly unremarkable in appearance. The ovaries are grossly symmetric. No suspicious adnexal masses are seen. Other: Trace ascites is noted about the liver and spleen. Musculoskeletal: No acute osseous abnormalities are identified. The visualized musculature is unremarkable in appearance. IMPRESSION: 1. Wall thickening along the distal ileum both proximal and distal to a focal herniated ileal segment within the patient's periumbilical hernia. This is concerning for an acute infectious or inflammatory ileitis. No  evidence of  obstruction. Fluid noted filling the moderate periumbilical hernia. Given the size of the herniated segment and surrounding fluid, this appears to be incarcerated. Mild associated mesenteric edema noted. 2. Findings of hepatic cirrhosis. Large gastric and esophageal varices noted. Trace ascites seen within the upper abdomen. Electronically Signed   By: Roanna Raider M.D.   On: 02/16/2018 23:28    Anti-infectives: Anti-infectives (From admission, onward)   Start     Dose/Rate Route Frequency Ordered Stop   02/17/18 0100  piperacillin-tazobactam (ZOSYN) IVPB 3.375 g     3.375 g 100 mL/hr over 30 Minutes Intravenous NOW 02/17/18 0050 02/17/18 0127      Assessment/Plan: s/p * No surgery found * continue clears  Will discuss the possibility of fixing hernia with primary team in am  LOS: 1 day    TOTH III,PAUL S 02/18/2018

## 2018-02-18 NOTE — Progress Notes (Signed)
Subjective/Chief Complaint: Complains of abd soreness. No nausea   Objective: Vital signs in last 24 hours: Temp:  [98.2 F (36.8 C)-98.4 F (36.9 C)] 98.2 F (36.8 C) (04/21 0643) Pulse Rate:  [62-81] 62 (04/21 0643) Resp:  [13-18] 13 (04/21 0643) BP: (91-106)/(54-67) 106/67 (04/21 0643) SpO2:  [96 %-98 %] 98 % (04/21 0643) Last BM Date: 02/13/18  Intake/Output from previous day: 04/20 0701 - 04/21 0700 In: 2880 [P.O.:480; I.V.:1800; IV Piggyback:600] Out: -  Intake/Output this shift: No intake/output data recorded.  General appearance: alert and cooperative Resp: clear to auscultation bilaterally Cardio: regular rate and rhythm GI: soft, minimal tenderness. hernia reducible easily  Lab Results:  Recent Labs    02/16/18 1734 02/17/18 0331  WBC 5.5 5.9  HGB 13.8 11.8*  HCT 40.5 34.9*  PLT 126* 115*   BMET Recent Labs    02/16/18 1734  NA 135  K 3.9  CL 97*  CO2 26  GLUCOSE 126*  BUN 11  CREATININE 0.71  CALCIUM 9.6   PT/INR Recent Labs    02/17/18 0347  LABPROT 15.2  INR 1.21   ABG No results for input(s): PHART, HCO3 in the last 72 hours.  Invalid input(s): PCO2, PO2  Studies/Results: Ct Abdomen Pelvis W Contrast  Result Date: 02/16/2018 CLINICAL DATA:  Acute onset of generalized abdominal pain, nausea and vomiting. EXAM: CT ABDOMEN AND PELVIS WITH CONTRAST TECHNIQUE: Multidetector CT imaging of the abdomen and pelvis was performed using the standard protocol following bolus administration of intravenous contrast. CONTRAST:  100mL ISOVUE-300 IOPAMIDOL (ISOVUE-300) INJECTION 61% COMPARISON:  None. FINDINGS: Lower chest: Minimal bibasilar atelectasis is noted. The visualized portions of the mediastinum are unremarkable. Hepatobiliary: The diffusely nodular contour of the liver is compatible with hepatic cirrhosis. No dominant mass is identified. The gallbladder is slightly thick walled, though this is nonspecific in the presence of trace ascites.  The common bile duct is normal in caliber. Pancreas: The pancreas is within normal limits. Spleen: The spleen is unremarkable in appearance. Adrenals/Urinary Tract: The adrenal glands are unremarkable in appearance. 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 The left kidney is unremarkable in appearance. There is no evidence of hydronephrosis. No renal or ureteral stones are identified. No perinephric stranding is seen. Stomach/Bowel: The stomach is unremarkable in appearance. Large gastric and esophageal varices are noted. There is wall thickening along the distal ileum both proximal and distal to a short herniated segment within the patient's periumbilical hernia, concerning for an acute infectious or inflammatory ileitis. This is not causing obstruction, though fluid is seen filling the moderate hernia. Given its size and surrounding fluid, this appears to be incarcerated. Mild associated mesenteric edema is noted. The remainder of the small bowel is grossly unremarkable. The appendix is normal in caliber, without evidence of appendicitis. The colon is unremarkable in appearance. Vascular/Lymphatic: The abdominal aorta is unremarkable in appearance. The inferior vena cava is grossly unremarkable. No retroperitoneal lymphadenopathy is seen. No pelvic sidewall lymphadenopathy is identified. Reproductive: The bladder is mildly distended and grossly unremarkable. The uterus is grossly unremarkable in appearance. The ovaries are grossly symmetric. No suspicious adnexal masses are seen. Other: Trace ascites is noted about the liver and spleen. Musculoskeletal: No acute osseous abnormalities are identified. The visualized musculature is unremarkable in appearance. IMPRESSION: 1. Wall thickening along the distal ileum both proximal and distal to a focal herniated ileal segment within the patient's periumbilical hernia. This is concerning for an acute infectious or inflammatory ileitis. No  evidence of  obstruction. Fluid noted filling the moderate periumbilical hernia. Given the size of the herniated segment and surrounding fluid, this appears to be incarcerated. Mild associated mesenteric edema noted. 2. Findings of hepatic cirrhosis. Large gastric and esophageal varices noted. Trace ascites seen within the upper abdomen. Electronically Signed   By: Roanna Raider M.D.   On: 02/16/2018 23:28    Anti-infectives: Anti-infectives (From admission, onward)   Start     Dose/Rate Route Frequency Ordered Stop   02/17/18 0100  piperacillin-tazobactam (ZOSYN) IVPB 3.375 g     3.375 g 100 mL/hr over 30 Minutes Intravenous NOW 02/17/18 0050 02/17/18 0127      Assessment/Plan: s/p * No surgery found * continue clears  Will discuss the possibility of fixing hernia with primary team in am  LOS: 1 day    TOTH III,Jolean Madariaga S 02/18/2018

## 2018-02-19 ENCOUNTER — Encounter (HOSPITAL_COMMUNITY): Admission: EM | Disposition: A | Discharge status: Home / Self Care | Payer: Self-pay | Source: Home / Self Care

## 2018-02-19 ENCOUNTER — Inpatient Hospital Stay (HOSPITAL_COMMUNITY): Payer: BLUE CROSS/BLUE SHIELD | Admitting: Anesthesiology

## 2018-02-19 ENCOUNTER — Encounter (HOSPITAL_COMMUNITY): Payer: Self-pay

## 2018-02-19 DIAGNOSIS — Z9889 Other specified postprocedural states: Secondary | ICD-10-CM

## 2018-02-19 DIAGNOSIS — Z8719 Personal history of other diseases of the digestive system: Secondary | ICD-10-CM

## 2018-02-19 HISTORY — PX: UMBILICAL HERNIA REPAIR: SHX196

## 2018-02-19 LAB — HEPATIC FUNCTION PANEL
ALBUMIN: 2.9 g/dL — AB (ref 3.5–5.0)
ALK PHOS: 95 U/L (ref 38–126)
ALT: 22 U/L (ref 14–54)
AST: 32 U/L (ref 15–41)
BILIRUBIN TOTAL: 1 mg/dL (ref 0.3–1.2)
Bilirubin, Direct: 0.3 mg/dL (ref 0.1–0.5)
Indirect Bilirubin: 0.7 mg/dL (ref 0.3–0.9)
Total Protein: 5.9 g/dL — ABNORMAL LOW (ref 6.5–8.1)

## 2018-02-19 LAB — BASIC METABOLIC PANEL
ANION GAP: 9 (ref 5–15)
BUN: 6 mg/dL (ref 6–20)
CHLORIDE: 105 mmol/L (ref 101–111)
CO2: 25 mmol/L (ref 22–32)
Calcium: 8.6 mg/dL — ABNORMAL LOW (ref 8.9–10.3)
Creatinine, Ser: 0.56 mg/dL (ref 0.44–1.00)
GFR calc non Af Amer: >60 mL/min (ref >60)
Glucose, Bld: 104 mg/dL — ABNORMAL HIGH (ref 65–99)
Potassium: 4 mmol/L (ref 3.5–5.1)
Sodium: 139 mmol/L (ref 135–145)

## 2018-02-19 LAB — SURGICAL PCR SCREEN
MRSA, PCR: NEGATIVE
Staphylococcus aureus: POSITIVE — AB

## 2018-02-19 LAB — CBC
HEMATOCRIT: 34.9 % — AB (ref 36.0–46.0)
HEMOGLOBIN: 11.7 g/dL — AB (ref 12.0–15.0)
MCH: 31.6 pg (ref 26.0–34.0)
MCHC: 33.5 g/dL (ref 30.0–36.0)
MCV: 94.3 fL (ref 78.0–100.0)
Platelets: 91 10*3/uL — ABNORMAL LOW (ref 150–400)
RBC: 3.7 MIL/uL — ABNORMAL LOW (ref 3.87–5.11)
RDW: 14.3 % (ref 11.5–15.5)
WBC: 3 10*3/uL — AB (ref 4.0–10.5)

## 2018-02-19 LAB — PROTIME-INR
INR: 1.21
Prothrombin Time: 15.2 seconds (ref 11.4–15.2)

## 2018-02-19 SURGERY — REPAIR, HERNIA, UMBILICAL, LAPAROSCOPIC
Anesthesia: General | Site: Abdomen

## 2018-02-19 MED ORDER — SUCCINYLCHOLINE CHLORIDE 200 MG/10ML IV SOSY
PREFILLED_SYRINGE | INTRAVENOUS | Status: DC | PRN
  Administered 2018-02-19: 100 mg via INTRAVENOUS

## 2018-02-19 MED ORDER — PROPOFOL 10 MG/ML IV BOLUS
INTRAVENOUS | Status: DC | PRN
  Administered 2018-02-19: 120 mg via INTRAVENOUS

## 2018-02-19 MED ORDER — LACTATED RINGERS IV SOLN
INTRAVENOUS | Status: DC
  Administered 2018-02-19: 13:00:00 via INTRAVENOUS

## 2018-02-19 MED ORDER — HYDROMORPHONE HCL 1 MG/ML IJ SOLN
INTRAMUSCULAR | Status: AC
  Filled 2018-02-19: qty 1

## 2018-02-19 MED ORDER — POLYETHYLENE GLYCOL 3350 17 G PO PACK
17.0000 g | PACK | Freq: Every day | ORAL | Status: DC
Start: 2018-02-19 — End: 2018-02-19
  Administered 2018-02-19: 17 g via ORAL
  Filled 2018-02-19: qty 1

## 2018-02-19 MED ORDER — MORPHINE SULFATE (PF) 2 MG/ML IV SOLN: 1.0000 mg | INTRAVENOUS | Status: DC | PRN

## 2018-02-19 MED ORDER — HYDROMORPHONE HCL 1 MG/ML IJ SOLN
0.2500 mg | INTRAMUSCULAR | Status: DC | PRN
  Administered 2018-02-19 (×4): 0.5 mg via INTRAVENOUS

## 2018-02-19 MED ORDER — ONDANSETRON HCL 4 MG/2ML IJ SOLN: 4.0000 mg | Freq: Four times a day (QID) | INTRAMUSCULAR | Status: DC | PRN

## 2018-02-19 MED ORDER — ONDANSETRON HCL 4 MG/2ML IJ SOLN
INTRAMUSCULAR | Status: DC | PRN
  Administered 2018-02-19: 4 mg via INTRAVENOUS

## 2018-02-19 MED ORDER — HYDRALAZINE HCL 20 MG/ML IJ SOLN
5.0000 mg | Freq: Once | INTRAMUSCULAR | Status: AC
  Administered 2018-02-19: 5 mg via INTRAVENOUS

## 2018-02-19 MED ORDER — FENTANYL CITRATE (PF) 100 MCG/2ML IJ SOLN
INTRAMUSCULAR | Status: DC | PRN
  Administered 2018-02-19 (×2): 50 ug via INTRAVENOUS
  Administered 2018-02-19: 100 ug via INTRAVENOUS
  Administered 2018-02-19: 50 ug via INTRAVENOUS

## 2018-02-19 MED ORDER — HYDROMORPHONE HCL 1 MG/ML IJ SOLN
INTRAMUSCULAR | Status: DC | PRN
  Administered 2018-02-19 (×4): 0.5 mg via INTRAVENOUS

## 2018-02-19 MED ORDER — HYDRALAZINE HCL 20 MG/ML IJ SOLN
INTRAMUSCULAR | Status: AC
  Filled 2018-02-19: qty 1

## 2018-02-19 MED ORDER — ONDANSETRON HCL 4 MG/2ML IJ SOLN
INTRAMUSCULAR | Status: AC
  Filled 2018-02-19: qty 2

## 2018-02-19 MED ORDER — DEXAMETHASONE SODIUM PHOSPHATE 10 MG/ML IJ SOLN
INTRAMUSCULAR | Status: DC | PRN
  Administered 2018-02-19: 10 mg via INTRAVENOUS

## 2018-02-19 MED ORDER — CEFOTETAN DISODIUM-DEXTROSE 2-2.08 GM-%(50ML) IV SOLR
INTRAVENOUS | Status: AC
  Filled 2018-02-19: qty 50

## 2018-02-19 MED ORDER — CHLORHEXIDINE GLUCONATE CLOTH 2 % EX PADS: 6.0000 | MEDICATED_PAD | Freq: Once | CUTANEOUS | Status: DC

## 2018-02-19 MED ORDER — ROCURONIUM BROMIDE 10 MG/ML (PF) SYRINGE
PREFILLED_SYRINGE | INTRAVENOUS | Status: AC
  Filled 2018-02-19: qty 5

## 2018-02-19 MED ORDER — 0.9 % SODIUM CHLORIDE (POUR BTL) OPTIME
TOPICAL | Status: DC | PRN
  Administered 2018-02-19: 1000 mL

## 2018-02-19 MED ORDER — KCL IN DEXTROSE-NACL 10-5-0.45 MEQ/L-%-% IV SOLN
INTRAVENOUS | Status: DC
  Administered 2018-02-19 – 2018-02-20 (×3): via INTRAVENOUS
  Filled 2018-02-19 (×4): qty 1000

## 2018-02-19 MED ORDER — ONDANSETRON 4 MG PO TBDP: 4.0000 mg | ORAL_TABLET | Freq: Four times a day (QID) | ORAL | Status: DC | PRN

## 2018-02-19 MED ORDER — SUGAMMADEX SODIUM 200 MG/2ML IV SOLN
INTRAVENOUS | Status: AC
  Filled 2018-02-19: qty 2

## 2018-02-19 MED ORDER — SODIUM CHLORIDE 0.9 % IV SOLN
2.0000 g | INTRAVENOUS | Status: AC
  Administered 2018-02-19: 2 g via INTRAVENOUS

## 2018-02-19 MED ORDER — LORAZEPAM 2 MG/ML IJ SOLN
1.0000 mg | Freq: Once | INTRAMUSCULAR | Status: AC
  Administered 2018-02-19: 1 mg via INTRAVENOUS
  Filled 2018-02-19: qty 1

## 2018-02-19 MED ORDER — PROPOFOL 10 MG/ML IV BOLUS
INTRAVENOUS | Status: AC
  Filled 2018-02-19: qty 20

## 2018-02-19 MED ORDER — SUGAMMADEX SODIUM 200 MG/2ML IV SOLN
INTRAVENOUS | Status: DC | PRN
  Administered 2018-02-19: 125 mg via INTRAVENOUS

## 2018-02-19 MED ORDER — FENTANYL CITRATE (PF) 250 MCG/5ML IJ SOLN
INTRAMUSCULAR | Status: AC
  Filled 2018-02-19: qty 5

## 2018-02-19 MED ORDER — HYDROMORPHONE HCL 2 MG/ML IJ SOLN
INTRAMUSCULAR | Status: AC
  Filled 2018-02-19: qty 1

## 2018-02-19 MED ORDER — OXYCODONE HCL 5 MG PO TABS
5.0000 mg | ORAL_TABLET | ORAL | Status: DC | PRN
  Administered 2018-02-20: 5 mg via ORAL
  Filled 2018-02-19: qty 1

## 2018-02-19 MED ORDER — MIDAZOLAM HCL 2 MG/2ML IJ SOLN
INTRAMUSCULAR | Status: AC
  Filled 2018-02-19: qty 2

## 2018-02-19 MED ORDER — ROCURONIUM BROMIDE 10 MG/ML (PF) SYRINGE
PREFILLED_SYRINGE | INTRAVENOUS | Status: DC | PRN
  Administered 2018-02-19: 40 mg via INTRAVENOUS

## 2018-02-19 MED ORDER — MIDAZOLAM HCL 5 MG/5ML IJ SOLN
INTRAMUSCULAR | Status: DC | PRN
  Administered 2018-02-19: 2 mg via INTRAVENOUS

## 2018-02-19 MED ORDER — BUPIVACAINE LIPOSOME 1.3 % IJ SUSP
20.0000 mL | Freq: Once | INTRAMUSCULAR | Status: DC
  Filled 2018-02-19: qty 20

## 2018-02-19 MED ORDER — DEXAMETHASONE SODIUM PHOSPHATE 10 MG/ML IJ SOLN
INTRAMUSCULAR | Status: AC
  Filled 2018-02-19: qty 1

## 2018-02-19 MED ORDER — CHLORHEXIDINE GLUCONATE CLOTH 2 % EX PADS
6.0000 | MEDICATED_PAD | Freq: Once | CUTANEOUS | Status: AC
  Administered 2018-02-19: 6 via TOPICAL

## 2018-02-19 SURGICAL SUPPLY — 44 items
BINDER ABDOMINAL 12 ML 46-62 (SOFTGOODS) ×2 IMPLANT
CABLE HI FREQUENCY MONOPOLAR (ELECTROSURGICAL) ×3 IMPLANT
CLOSURE WOUND 1/2 X4 (GAUZE/BANDAGES/DRESSINGS)
COVER SURGICAL LIGHT HANDLE (MISCELLANEOUS) ×3 IMPLANT
DECANTER SPIKE VIAL GLASS SM (MISCELLANEOUS) ×3 IMPLANT
DEVICE PMI PUNCTURE CLOSURE (MISCELLANEOUS) ×2 IMPLANT
DEVICE SECURE STRAP 25 ABSORB (INSTRUMENTS) ×2 IMPLANT
DEVICE TROCAR PUNCTURE CLOSURE (ENDOMECHANICALS) IMPLANT
DISSECTOR BLUNT TIP ENDO 5MM (MISCELLANEOUS) IMPLANT
DRAIN CHANNEL 19F RND (DRAIN) IMPLANT
DRSG OPSITE POSTOP 3X4 (GAUZE/BANDAGES/DRESSINGS) ×2 IMPLANT
ELECT PENCIL ROCKER SW 15FT (MISCELLANEOUS) ×3 IMPLANT
ELECT REM PT RETURN 15FT ADLT (MISCELLANEOUS) ×3 IMPLANT
EVACUATOR SILICONE 100CC (DRAIN) IMPLANT
GLOVE BIOGEL M 8.0 STRL (GLOVE) ×3 IMPLANT
GOWN SPEC L4 XLG W/TWL (GOWN DISPOSABLE) ×3 IMPLANT
GOWN STRL REUS W/TWL XL LVL3 (GOWN DISPOSABLE) ×9 IMPLANT
IRRIG SUCT STRYKERFLOW 2 WTIP (MISCELLANEOUS)
IRRIGATION SUCT STRKRFLW 2 WTP (MISCELLANEOUS) IMPLANT
KIT BASIN OR (CUSTOM PROCEDURE TRAY) ×3 IMPLANT
MARKER SKIN DUAL TIP RULER LAB (MISCELLANEOUS) IMPLANT
MESH VENT ST 11.4CM L CIR (Mesh General) ×2 IMPLANT
NDL SPNL 22GX3.5 QUINCKE BK (NEEDLE) IMPLANT
NEEDLE SPNL 22GX3.5 QUINCKE BK (NEEDLE) IMPLANT
PAD POSITIONING PINK XL (MISCELLANEOUS) IMPLANT
POSITIONER SURGICAL ARM (MISCELLANEOUS) IMPLANT
SCISSORS LAP 5X45 EPIX DISP (ENDOMECHANICALS) ×3 IMPLANT
SHEARS CURVED HARMONIC AC 45CM (MISCELLANEOUS) ×2 IMPLANT
SLEEVE XCEL OPT CAN 5 100 (ENDOMECHANICALS) ×6 IMPLANT
SOLUTION ANTI FOG 6CC (MISCELLANEOUS) ×3 IMPLANT
STAPLER VISISTAT 35W (STAPLE) IMPLANT
STRIP CLOSURE SKIN 1/2X4 (GAUZE/BANDAGES/DRESSINGS) IMPLANT
SUT NOVA 0 T19/GS 22DT (SUTURE) IMPLANT
SUT NOVA NAB DX-16 0-1 5-0 T12 (SUTURE) ×2 IMPLANT
SUT NOVA NAB GS-21 0 18 T12 DT (SUTURE) ×4 IMPLANT
SUT VIC AB 4-0 SH 18 (SUTURE) ×3 IMPLANT
TACKER 5MM HERNIA 3.5CML NAB (ENDOMECHANICALS) ×1 IMPLANT
TOWEL OR 17X26 10 PK STRL BLUE (TOWEL DISPOSABLE) ×3 IMPLANT
TOWEL OR NON WOVEN STRL DISP B (DISPOSABLE) ×3 IMPLANT
TRAY FOLEY W/METER SILVER 16FR (SET/KITS/TRAYS/PACK) ×1 IMPLANT
TRAY LAPAROSCOPIC (CUSTOM PROCEDURE TRAY) ×3 IMPLANT
TROCAR BLADELESS OPT 5 100 (ENDOMECHANICALS) ×3 IMPLANT
TROCAR XCEL NON-BLD 11X100MML (ENDOMECHANICALS) ×3 IMPLANT
TUBING INSUF HEATED (TUBING) ×3 IMPLANT

## 2018-02-19 NOTE — Progress Notes (Signed)
[facility] Progress Note     Subjective: CC- abdominal pain Patient reports persistent abdominal pain from ventral hernia. Denies any recent n/v. She has not had a BM in 1 week. Passing flatus. States that she is very nervous about being in the hospital and anxious about surgery.   Objective: Vital signs in last 24 hours: Temp:  [98.1 F (36.7 C)-98.5 F (36.9 C)] 98.5 F (36.9 C) (04/22 0600) Pulse Rate:  [80-86] 80 (04/22 0600) Resp:  [17-18] 17 (04/22 0600) BP: (139-151)/(73-84) 139/73 (04/22 0600) SpO2:  [97 %-100 %] 97 % (04/22 0600) Last BM Date: 02/13/18  Intake/Output from previous day: 04/21 0701 - 04/22 0700 In: 2815 [P.O.:840; I.V.:1875; IV Piggyback:100] Out: -  Intake/Output this shift: No intake/output data recorded.  PE: Gen:  Alert, NAD, tearful HEENT: EOM's intact, pupils equal and round Card:  RRR, +murmur Pulm:  CTAB, no W/R/R, effort normal Abd: Soft, ND, +BS, no HSM, soft and reducible umbilical hernia; soft and reducible ventral hernia just proximal to umbilicus Ext: calves soft and nontender Psych: A&Ox3  Skin: no rashes noted, warm and dry  Lab Results:  Recent Labs    02/17/18 0331 02/19/18 0728  WBC 5.9 3.0*  HGB 11.8* 11.7*  HCT 34.9* 34.9*  PLT 115* 91*   BMET Recent Labs    02/16/18 1734 02/19/18 0728  NA 135 139  K 3.9 4.0  CL 97* 105  CO2 26 25  GLUCOSE 126* 104*  BUN 11 6  CREATININE 0.71 0.56  CALCIUM 9.6 8.6*   PT/INR Recent Labs    02/17/18 0347  LABPROT 15.2  INR 1.21   CMP     Component Value Date/Time   NA 139 02/19/2018 0728   K 4.0 02/19/2018 0728   CL 105 02/19/2018 0728   CO2 25 02/19/2018 0728   GLUCOSE 104 (H) 02/19/2018 0728   BUN 6 02/19/2018 0728   CREATININE 0.56 02/19/2018 0728   CALCIUM 8.6 (L) 02/19/2018 0728   PROT 7.9 02/16/2018 1734   ALBUMIN 4.0 02/16/2018 1734   AST 35 02/16/2018 1734   ALT 25 02/16/2018 1734   ALKPHOS 126 02/16/2018 1734   BILITOT 1.6 (H)  02/16/2018 1734   GFRNONAA >60 02/19/2018 0728   GFRAA >60 02/19/2018 0728   Lipase     Component Value Date/Time   LIPASE 31 02/16/2018 1734       Studies/Results: No results found.  Anti-infectives: Anti-infectives (From admission, onward)   Start     Dose/Rate Route Frequency Ordered Stop   02/17/18 0100  piperacillin-tazobactam (ZOSYN) IVPB 3.375 g     3.375 g 100 mL/hr over 30 Minutes Intravenous NOW 02/17/18 0050 02/17/18 0127       Assessment/Plan Heart murmur - worked up outpatient and cleared for elective hernia repair Anxiety  HTN Cirrhosis H/o narcotic abuse - [facility]  Reducible umbilical hernia Reducible ventral hernia ?Inflammatory ileitis - CT scan showed wall thickening along the distal ileum both proximal and distal to a focal herniated ileal segment within the patient's periumbilical hernia. This is concerning for an acute infectious or inflammatory ileitis. No evidence of obstruction. Fluid noted filling the moderate periumbilical hernia. Given the size of the herniated segment and surrounding fluid, this appears to be incarcerated. Mild associated mesenteric edema noted - hernias soft and reducible  ID - none currently FEN - IVF, NPO for possible procedure VTE - SCDs Foley - none Follow up - TBD  Plan - Plan  for OR today for laparoscopic umbilical and ventral hernia repair, possible open. Keep NPO. Given single dose of ativan for anxiety.   LOS: 2 days    Franne Forts , [facility] 02/19/2018, 9:33 AM Pager: [phone] Consults: [phone] Mon-Fri 7:00 am-4:30 pm Sat-Sun 7:00 am-11:30 am

## 2018-02-19 NOTE — Discharge Instructions (Signed)
[facility] _______[facility] ° °UMBILICAL OR INGUINAL HERNIA REPAIR: POST OP INSTRUCTIONS °No lifting over 10 pounds for 6 weeks. °Always review your discharge instruction sheet given to you by the facility where your surgery was performed. °IF YOU HAVE DISABILITY OR FAMILY LEAVE FORMS, YOU MUST BRING THEM TO THE OFFICE FOR PROCESSING.   °DO NOT GIVE THEM TO YOUR DOCTOR. ° °1. A  prescription for pain medication may be given to you upon discharge.  Take your pain medication as prescribed, if needed.  If narcotic pain medicine is not needed, then you may take acetaminophen (Tylenol) or ibuprofen (Advil) as needed. °2. Take your usually prescribed medications unless otherwise directed. °If you need a refill on your pain medication, please contact your pharmacy.  They will contact our office to request authorization. Prescriptions will not be filled after 5 pm or on week-ends. °3. You should follow a light diet the first 24 hours after arrival home, such as soup and crackers, etc.  Be sure to include lots of fluids daily.  Resume your normal diet the day after surgery. °4.Most patients will experience some swelling and bruising around the umbilicus or in the groin and scrotum.  Ice packs and reclining will help.  Swelling and bruising can take several days to resolve.  °6. It is common to experience some constipation if taking pain medication after surgery.  Increasing fluid intake and taking a stool softener (such as Colace) will usually help or prevent this problem from occurring.  A mild laxative (Milk of Magnesia or Miralax) should be taken according to package directions if there are no bowel movements after 48 hours. °7. Unless discharge instructions indicate otherwise, you may remove your bandages 24-48 hours after surgery, and you may shower at that time.  You may have steri-strips (small skin tapes) in place directly over the incision.  These strips should be left on the skin for 7-10 days.  If your  surgeon used skin glue on the incision, you may shower in 24 hours.  The glue will flake off over the next 2-3 weeks.  Any sutures or staples will be removed at the office during your follow-up visit. °8. ACTIVITIES:  You may resume regular (light) daily activities beginning the next day--such as daily self-care, walking, climbing stairs--gradually increasing activities as tolerated.  You may have sexual intercourse when it is comfortable.  Refrain from any heavy lifting or straining until approved by your doctor. ° °a.You may drive when you are no longer taking prescription pain medication, you can comfortably wear a seatbelt, and you can safely maneuver your car and apply brakes. °b.RETURN TO WORK:   °_____________________________________________ ° °9.You should see your doctor in the office for a follow-up appointment approximately 2-3 weeks after your surgery.  Make sure that you call for this appointment within a day or two after you arrive home to insure a convenient appointment time. °10.OTHER INSTRUCTIONS: _________________________ °   _____________________________________ ° °WHEN TO CALL YOUR DOCTOR: °1. Fever over 101.0 °2. Inability to urinate °3. Nausea and/or vomiting °4. Extreme swelling or bruising °5. Continued bleeding from incision. °6. Increased pain, redness, or drainage from the incision ° °The clinic staff is available to answer your questions during regular business hours.  Please don’t hesitate to call and ask to speak to one of the nurses for clinical concerns.  If you have a medical emergency, go to the nearest emergency room or call 911.  A surgeon from [facility] is always   on call at the hospital ° ° °[address], [address], [place], [place]  [address] ? ° [address] [address], [place], [place]   [address] °[phone] ? [phone] ? FAX [phone] °Web site: [email or web address] °

## 2018-02-19 NOTE — Anesthesia Preprocedure Evaluation (Addendum)
Anesthesia Evaluation  Patient identified by MRN, date of birth, ID band Patient awake    Reviewed: Allergy & Precautions, H&P , NPO status , Patient's Chart, lab work & pertinent test results, reviewed documented beta blocker date and time   Airway Mallampati: II  TM Distance: >3 FB Neck ROM: Full    Dental no notable dental hx. (+) Poor Dentition, Dental Advisory Given   Pulmonary neg pulmonary ROS,    Pulmonary exam normal breath sounds clear to auscultation       Cardiovascular hypertension, Pt. on medications and Pt. on home beta blockers  Rhythm:Regular Rate:Normal     Neuro/Psych negative neurological ROS  negative psych ROS   GI/Hepatic negative GI ROS, (+) Cirrhosis       ,   Endo/Other  negative endocrine ROS  Renal/GU negative Renal ROS  negative genitourinary   Musculoskeletal   Abdominal   Peds  Hematology negative hematology ROS (+)   Anesthesia Other Findings   Reproductive/Obstetrics negative OB ROS                            Anesthesia Physical Anesthesia Plan  ASA: III  Anesthesia Plan: General   Post-op Pain Management:    Induction: Intravenous  PONV Risk Score and Plan: 4 or greater and Ondansetron and Dexamethasone  Airway Management Planned: Oral ETT  Additional Equipment:   Intra-op Plan:   Post-operative Plan: Extubation in OR  Informed Consent: I have reviewed the patients History and Physical, chart, labs and discussed the procedure including the risks, benefits and alternatives for the proposed anesthesia with the patient or authorized representative who has indicated his/her understanding and acceptance.   Dental advisory given  Plan Discussed with: CRNA  Anesthesia Plan Comments:        Anesthesia Quick Evaluation

## 2018-02-19 NOTE — Anesthesia Procedure Notes (Signed)
Procedure Name: Intubation Date/Time: 02/19/2018 3:38 PM Performed by: Anne Fu, CRNA Pre-anesthesia Checklist: Patient identified, Emergency Drugs available, Suction available, Patient being monitored and Timeout performed Patient Re-evaluated:Patient Re-evaluated prior to induction Oxygen Delivery Method: Circle system utilized Preoxygenation: Pre-oxygenation with 100% oxygen Induction Type: IV induction Ventilation: Mask ventilation without difficulty Laryngoscope Size: Mac and 4 Grade View: Grade II Tube type: Oral Tube size: 7.5 mm Number of attempts: 1 Airway Equipment and Method: Stylet Placement Confirmation: ETT inserted through vocal cords under direct vision,  positive ETCO2 and breath sounds checked- equal and bilateral Secured at: 19 cm Tube secured with: Tape Dental Injury: Teeth and Oropharynx as per pre-operative assessment

## 2018-02-19 NOTE — Transfer of Care (Signed)
Immediate Anesthesia Transfer of Care Note  Patient: Suzanne Barajas  Procedure(s) Performed: Procedure(s): LAPAROSCOPIC UMBILICAL HERNIA WITH MESH (N/A)  Patient Location: PACU  Anesthesia Type:General  Level of Consciousness:  sedated, patient cooperative and responds to stimulation  Airway & Oxygen Therapy:Patient Spontanous Breathing and Patient connected to face mask oxgen  Post-op Assessment:  Report given to PACU RN and Post -op Vital signs reviewed and stable  Post vital signs:  Reviewed and stable  Last Vitals:  Vitals:   02/18/18 2108 02/19/18 0600  BP: (!) 151/78 139/73  Pulse: 86 80  Resp: 17 17  Temp: 36.8 C 36.9 C  SpO2: 98% 97%    Complications: No apparent anesthesia complications

## 2018-02-19 NOTE — Interval H&P Note (Signed)
History and Physical Interval Note:  02/19/2018 3:20 PM  Suzanne KatosDonna Eisman  has presented today for surgery, with the diagnosis of umbilical hernia  The various methods of treatment have been discussed with the patient and family. After consideration of risks, benefits and other options for treatment, the patient has consented to  Procedure(s): LAPAROSCOPIC UMBILICAL HERNIA (N/A) as a surgical intervention .  The patient's history has been reviewed, patient examined, no change in status, stable for surgery.  I have reviewed the patient's chart and labs.  Questions were answered to the patient's satisfaction.     Valarie MerinoMatthew B Rollo Farquhar

## 2018-02-19 NOTE — Anesthesia Postprocedure Evaluation (Signed)
Anesthesia Post Note  Patient: Suzanne Barajas  Procedure(s) Performed: LAPAROSCOPIC UMBILICAL HERNIA WITH MESH (N/A Abdomen)     Patient location during evaluation: PACU Anesthesia Type: General Level of consciousness: awake and alert Pain management: pain level controlled Vital Signs Assessment: post-procedure vital signs reviewed and stable Respiratory status: spontaneous breathing, nonlabored ventilation, respiratory function stable and patient connected to nasal cannula oxygen Cardiovascular status: blood pressure returned to baseline and stable Postop Assessment: no apparent nausea or vomiting Anesthetic complications: no    Last Vitals:  Vitals:   02/19/18 1815 02/19/18 1832  BP: 134/73 129/86  Pulse: 79 84  Resp: 17 18  Temp: (!) 36.3 C (!) 36.4 C  SpO2: 100% 99%    Last Pain:  Vitals:   02/19/18 1832  TempSrc: Oral  PainSc:                  Danyel Griess,W. EDMOND

## 2018-02-19 NOTE — Op Note (Addendum)
Suzanne KatosDonna Barajas  May 28, 1956 February 19, 2018   PCP:  System, Pcp Not In   Surgeon: Wenda LowMatt Gudelia Eugene, MD, FACS  Asst:  Ovidio Kinavid Newman, MD, FACS  Anes:  general  Preop Dx: Hx of incarcerated ventral hernia Postop Dx: same  Procedure: Laparoscopically assisted repair of two ventral hernias with 11.4 cm circular Ventralex mesh secured with the absorbable tackers and 6 full thickness 0 Novafil sutures placed with PMI device Location Surgery: [facility] Complications: None noted  EBL:   minimal cc  Drains: none  Description of Procedure:  The patient was taken to OR 4 .  After anesthesia was administered and the patient was prepped a timeout was performed.  Access to the abdomen was achieved with a 5 mm Optiview through the left upper quadrant.  A second 5 mm was placed in the left lower quadrant.  2 bulging hernia defects were immediately obvious one at the umbilicus and one just above that.  There was also a broad-based area in the upper midline but I reviewed the CT of and did not feel that it caused her any imminent danger.The small bowel was surveyed with the angled scope in the ileum and there appeared no compromised bowel.  The liver was severely cirrhotic.    The patient has severe cirrhosis with varices and I wanted to perform an operation to deal with the site of incarceration the other night.  This was a about 3 cm circular defect at the umbilicus.  There was a second defect just above that.  I elected to cut down on these individually the lower one with a transverse incision and the other one was in an old midline incision.  I dissected the sac free down to the fascia and excised the sac leaving the fascial rings.  I inserted a piece of ventral Lex mesh 11.4 cm in diameter and secured to the edge of the uppermost defect before placing it in and then tying that down and then closed that defect with interrupted sutures of 0 Novafil.  The lowermost defect I did a vest overpants repair again using the 0  Novafil closing that defect as well.  We then inserted the scope and reinflated her and the mesh was lying nicely above covering both defects with about a 3 cm margin.  I used the secure strap absorbable tacker to secure this to the abdominal wall and then I came from the outside and placed 6 additional full-thickness 0 Novafil's to anchor it to the abdominal wall.  There is no bleeding below.  I used the harmonic scalpel to take down some adhesions when I went in and I elected to close the incisions with 4-0 Vicryl and with staples.  The patient tolerated the procedure well and was taken to the PACU in stable condition.     Matt B. Daphine DeutscherMartin, MD, [facility], [place] [phone]

## 2018-02-20 ENCOUNTER — Encounter (HOSPITAL_COMMUNITY): Payer: Self-pay | Admitting: Surgery

## 2018-02-20 LAB — CBC
HEMATOCRIT: 35.9 % — AB (ref 36.0–46.0)
Hemoglobin: 11.8 g/dL — ABNORMAL LOW (ref 12.0–15.0)
MCH: 30.6 pg (ref 26.0–34.0)
MCHC: 32.9 g/dL (ref 30.0–36.0)
MCV: 93.2 fL (ref 78.0–100.0)
PLATELETS: 104 10*3/uL — AB (ref 150–400)
RBC: 3.85 MIL/uL — AB (ref 3.87–5.11)
RDW: 13.9 % (ref 11.5–15.5)
WBC: 7.2 10*3/uL (ref 4.0–10.5)

## 2018-02-20 LAB — BASIC METABOLIC PANEL
Anion gap: 9 (ref 5–15)
BUN: 8 mg/dL (ref 6–20)
CO2: 25 mmol/L (ref 22–32)
Calcium: 8.8 mg/dL — ABNORMAL LOW (ref 8.9–10.3)
Chloride: 100 mmol/L — ABNORMAL LOW (ref 101–111)
Creatinine, Ser: 0.49 mg/dL (ref 0.44–1.00)
GFR calc Af Amer: >60 mL/min (ref >60)
GLUCOSE: 171 mg/dL — AB (ref 65–99)
Potassium: 4.3 mmol/L (ref 3.5–5.1)
Sodium: 134 mmol/L — ABNORMAL LOW (ref 135–145)

## 2018-02-20 MED ORDER — KETOROLAC TROMETHAMINE 15 MG/ML IJ SOLN
15.0000 mg | Freq: Four times a day (QID) | INTRAMUSCULAR | Status: DC | PRN
  Administered 2018-02-20 – 2018-02-21 (×4): 15 mg via INTRAVENOUS
  Filled 2018-02-20 (×4): qty 1

## 2018-02-20 MED ORDER — ENOXAPARIN SODIUM 40 MG/0.4ML ~~LOC~~ SOLN
40.0000 mg | SUBCUTANEOUS | Status: DC
  Administered 2018-02-20 – 2018-02-22 (×3): 40 mg via SUBCUTANEOUS
  Filled 2018-02-20 (×3): qty 0.4

## 2018-02-20 MED ORDER — PANTOPRAZOLE SODIUM 40 MG PO TBEC
40.0000 mg | DELAYED_RELEASE_TABLET | Freq: Every day | ORAL | Status: DC
  Administered 2018-02-20 – 2018-02-22 (×3): 40 mg via ORAL
  Filled 2018-02-20 (×3): qty 1

## 2018-02-20 MED ORDER — MORPHINE SULFATE (PF) 2 MG/ML IV SOLN
1.0000 mg | INTRAVENOUS | Status: DC | PRN
  Administered 2018-02-20: 1 mg via INTRAVENOUS
  Filled 2018-02-20: qty 1

## 2018-02-20 MED ORDER — OXYCODONE HCL 5 MG PO TABS
5.0000 mg | ORAL_TABLET | Freq: Four times a day (QID) | ORAL | Status: DC | PRN
  Administered 2018-02-20 – 2018-02-22 (×8): 5 mg via ORAL
  Filled 2018-02-20 (×8): qty 1

## 2018-02-20 MED ORDER — ACETAMINOPHEN 325 MG PO TABS
650.0000 mg | ORAL_TABLET | Freq: Four times a day (QID) | ORAL | Status: DC | PRN
  Administered 2018-02-20: 650 mg via ORAL
  Filled 2018-02-20: qty 2

## 2018-02-20 MED ORDER — METHOCARBAMOL 500 MG PO TABS
500.0000 mg | ORAL_TABLET | Freq: Three times a day (TID) | ORAL | Status: DC | PRN
  Administered 2018-02-22: 500 mg via ORAL
  Filled 2018-02-20: qty 1

## 2018-02-20 MED ORDER — POLYETHYLENE GLYCOL 3350 17 G PO PACK: 17.0000 g | PACK | Freq: Every day | ORAL | Status: DC | PRN

## 2018-02-20 MED ORDER — DOCUSATE SODIUM 100 MG PO CAPS
100.0000 mg | ORAL_CAPSULE | Freq: Two times a day (BID) | ORAL | Status: DC
  Administered 2018-02-20 – 2018-02-22 (×5): 100 mg via ORAL
  Filled 2018-02-20 (×5): qty 1

## 2018-02-20 NOTE — Progress Notes (Signed)
[facility] Progress Note  1 Day Post-Op  Subjective: CC- abdominal pain Patient states that her abdomen is very sore this morning. Oxycodone helps. She does report some nausea, no emesis. No flatus or BM since surgery. She is tolerating few sips of clear liquids. She has not been OOB yet since surgery.  Objective: Vital signs in last 24 hours: Temp:  [97.4 F (36.3 C)-98.4 F (36.9 C)] 98.2 F (36.8 C) (04/23 0438) Pulse Rate:  [68-90] 90 (04/23 0438) Resp:  [13-18] 18 (04/23 0438) BP: (120-183)/(73-97) 135/88 (04/23 0438) SpO2:  [97 %-100 %] 99 % (04/23 0438) Last BM Date: 02/13/18  Intake/Output from previous day: 04/22 0701 - 04/23 0700 In: 2530.4 [I.V.:2380.4; IV Piggyback:150] Out: 425 [Urine:400; Blood:25] Intake/Output this shift: No intake/output data recorded.  PE: Gen:  Alert, NAD HEENT: EOM's intact, pupils equal and round Card:  RRR, no M/G/R heard Pulm:  CTAB, no W/R/R, effort normal Abd: Soft, ND, appropriately tender, few BS heard, midline incision cdi with staples intact and honeycomb dressing in place, few lap incisions with dressing intact/trace dried blood Ext:  Calves soft and nontender  Lab Results:  Recent Labs    02/19/18 0728 02/20/18 0718  WBC 3.0* 7.2  HGB 11.7* 11.8*  HCT 34.9* 35.9*  PLT 91* 104*   BMET Recent Labs    02/19/18 0728  NA 139  K 4.0  CL 105  CO2 25  GLUCOSE 104*  BUN 6  CREATININE 0.56  CALCIUM 8.6*   PT/INR Recent Labs    02/19/18 1325  LABPROT 15.2  INR 1.21   CMP     Component Value Date/Time   NA 139 02/19/2018 0728   K 4.0 02/19/2018 0728   CL 105 02/19/2018 0728   CO2 25 02/19/2018 0728   GLUCOSE 104 (H) 02/19/2018 0728   BUN 6 02/19/2018 0728   CREATININE 0.56 02/19/2018 0728   CALCIUM 8.6 (L) 02/19/2018 0728   PROT 5.9 (L) 02/19/2018 0728   ALBUMIN 2.9 (L) 02/19/2018 0728   AST 32 02/19/2018 0728   ALT 22 02/19/2018 0728   ALKPHOS 95 02/19/2018 0728   BILITOT 1.0  02/19/2018 0728   GFRNONAA >60 02/19/2018 0728   GFRAA >60 02/19/2018 0728   Lipase     Component Value Date/Time   LIPASE 31 02/16/2018 1734       Studies/Results: No results found.  Anti-infectives: Anti-infectives (From admission, onward)   Start     Dose/Rate Route Frequency Ordered Stop   02/20/18 0600  cefoTEtan (CEFOTAN) 2 g in sodium chloride 0.9 % 100 mL IVPB     2 g 200 mL/hr over 30 Minutes Intravenous On call to O.R. 02/19/18 1253 02/19/18 1546   02/19/18 1339  cefoTEtan in Dextrose 5% (CEFOTAN) 2-2.08 GM-%(50ML) IVPB    Note to Pharmacy:  Montel Clock   : cabinet override      02/19/18 1339 02/20/18 0144   02/17/18 0100  piperacillin-tazobactam (ZOSYN) IVPB 3.375 g     3.375 g 100 mL/hr over 30 Minutes Intravenous NOW 02/17/18 0050 02/17/18 0127       Assessment/Plan Heart murmur - worked up outpatient and cleared for elective hernia repair Anxiety  HTN Cirrhosis H/o narcotic and alcohol abuse - recent admission at [facility] ADHD  Hx of incarcerated ventral hernia S/p Laparoscopically assisted repair of two ventral hernias with 11.4 cm circular Ventralex mesh secured with the absorbable tackers and 6 full thickness 0 Novafil sutures placed with PMI device 4/22  Dr. Daphine DeutscherMartin - POD 1 - no flatus or BM - BMP pending. Continue clear liquids for now due to nausea. Await bowel function. Encourage OOB/ambulation today. Add toradol and robaxin for better pain control.  ID - none currently FEN - IVF, CLD VTE - SCDs, lovenox Foley - none Follow up - Dr. Daphine DeutscherMartin   LOS: 3 days    Franne FortsBrooke A Ruba Outen , [facility] 02/20/2018, 8:09 AM Pager: [phone] Consults: [phone] Mon-Fri 7:00 am-4:30 pm Sat-Sun 7:00 am-11:30 am

## 2018-02-21 LAB — CBC
HEMATOCRIT: 32.9 % — AB (ref 36.0–46.0)
Hemoglobin: 10.9 g/dL — ABNORMAL LOW (ref 12.0–15.0)
MCH: 30.9 pg (ref 26.0–34.0)
MCHC: 33.1 g/dL (ref 30.0–36.0)
MCV: 93.2 fL (ref 78.0–100.0)
Platelets: 105 10*3/uL — ABNORMAL LOW (ref 150–400)
RBC: 3.53 MIL/uL — ABNORMAL LOW (ref 3.87–5.11)
RDW: 14.2 % (ref 11.5–15.5)
WBC: 6.3 10*3/uL (ref 4.0–10.5)

## 2018-02-21 LAB — BASIC METABOLIC PANEL
Anion gap: 8 (ref 5–15)
BUN: 8 mg/dL (ref 6–20)
CALCIUM: 8.3 mg/dL — AB (ref 8.9–10.3)
CO2: 25 mmol/L (ref 22–32)
CREATININE: 0.49 mg/dL (ref 0.44–1.00)
Chloride: 105 mmol/L (ref 101–111)
GFR calc Af Amer: >60 mL/min (ref >60)
GFR calc non Af Amer: >60 mL/min (ref >60)
GLUCOSE: 110 mg/dL — AB (ref 65–99)
Potassium: 4.5 mmol/L (ref 3.5–5.1)
Sodium: 138 mmol/L (ref 135–145)

## 2018-02-21 MED ORDER — BISACODYL 10 MG RE SUPP: 10.0000 mg | Freq: Every day | RECTAL | Status: DC | PRN

## 2018-02-21 MED ORDER — POLYETHYLENE GLYCOL 3350 17 G PO PACK
17.0000 g | PACK | Freq: Once | ORAL | Status: AC
  Administered 2018-02-21: 17 g via ORAL
  Filled 2018-02-21: qty 1

## 2018-02-21 MED ORDER — BISACODYL 10 MG RE SUPP
10.0000 mg | Freq: Once | RECTAL | Status: AC
  Administered 2018-02-21: 10 mg via RECTAL
  Filled 2018-02-21: qty 1

## 2018-02-21 NOTE — Progress Notes (Signed)
[facility] Progress Note  2 Days Post-Op  Subjective: CC- tired Patient states that she is tired this morning. Abdominal pain improving. Denies n/v. Tolerating full liquids. States that she is starting to feel hungry. Passing flatus, no BM.  Objective: Vital signs in last 24 hours: Temp:  [98.1 F (36.7 C)-98.5 F (36.9 C)] 98.5 F (36.9 C) (04/24 0552) Pulse Rate:  [73-111] 73 (04/24 0552) Resp:  [16-18] 16 (04/24 0552) BP: (125-132)/(71-78) 125/71 (04/24 0552) SpO2:  [98 %-100 %] 98 % (04/24 0552) Last BM Date: 02/13/18  Intake/Output from previous day: 04/23 0701 - 04/24 0700 In: 720 [P.O.:720] Out: -  Intake/Output this shift: No intake/output data recorded.  PE: Gen:  Alert, NAD HEENT: EOM's intact, pupils equal and round Card:  RRR, no M/G/R heard Pulm:  CTAB, no W/R/R, effort normal Abd: Soft, ND, appropriately tender, + BS, midline incision cdi with staples intact and honeycomb dressing in place, few lap incisions cdi with single staple intact Ext:  Calves soft and nontender  Lab Results:  Recent Labs    02/20/18 0718 02/21/18 0359  WBC 7.2 6.3  HGB 11.8* 10.9*  HCT 35.9* 32.9*  PLT 104* 105*   BMET Recent Labs    02/20/18 0718 02/21/18 0359  NA 134* 138  K 4.3 4.5  CL 100* 105  CO2 25 25  GLUCOSE 171* 110*  BUN 8 8  CREATININE 0.49 0.49  CALCIUM 8.8* 8.3*   PT/INR Recent Labs    02/19/18 1325  LABPROT 15.2  INR 1.21   CMP     Component Value Date/Time   NA 138 02/21/2018 0359   K 4.5 02/21/2018 0359   CL 105 02/21/2018 0359   CO2 25 02/21/2018 0359   GLUCOSE 110 (H) 02/21/2018 0359   BUN 8 02/21/2018 0359   CREATININE 0.49 02/21/2018 0359   CALCIUM 8.3 (L) 02/21/2018 0359   PROT 5.9 (L) 02/19/2018 0728   ALBUMIN 2.9 (L) 02/19/2018 0728   AST 32 02/19/2018 0728   ALT 22 02/19/2018 0728   ALKPHOS 95 02/19/2018 0728   BILITOT 1.0 02/19/2018 0728   GFRNONAA >60 02/21/2018 0359   GFRAA >60 02/21/2018 0359    Lipase     Component Value Date/Time   LIPASE 31 02/16/2018 1734       Studies/Results: No results found.  Anti-infectives: Anti-infectives (From admission, onward)   Start     Dose/Rate Route Frequency Ordered Stop   02/20/18 0600  cefoTEtan (CEFOTAN) 2 g in sodium chloride 0.9 % 100 mL IVPB     2 g 200 mL/hr over 30 Minutes Intravenous On call to O.R. 02/19/18 1253 02/19/18 1546   02/19/18 1339  cefoTEtan in Dextrose 5% (CEFOTAN) 2-2.08 GM-%(50ML) IVPB    Note to Pharmacy:  Montel ClockBenfield, Lindsay   : cabinet override      02/19/18 1339 02/20/18 0144   02/17/18 0100  piperacillin-tazobactam (ZOSYN) IVPB 3.375 g     3.375 g 100 mL/hr over 30 Minutes Intravenous NOW 02/17/18 0050 02/17/18 0127       Assessment/Plan Heart murmur - worked up outpatient and cleared forelective hernia repair Anxiety  HTN Cirrhosis H/o narcotic and alcohol abuse - [facility] admission [facility] ADHD  Hx of incarcerated ventral hernia S/p Laparoscopically assisted repair of two ventral hernias with 11.4 cm circular Ventralex mesh secured with the absorbable tackers and 6 full thickness 0 Novafil sutures placed with PMI device 4/22 Dr. Daphine DeutscherMartin - POD 2 - passing gas, no  BM, tolerating fulls, increased bowel sounds on exam - Advance to soft diet. Give dose of miralax. Continue ambulating. Continue working on pain control. Would prefer that she have a BM prior to discharge.  ID -none currently FEN - soft diet VTE -SCDs, lovenox Foley -none Follow up -Dr. Daphine Deutscher   LOS: 4 days    Franne Forts , [facility] 02/21/2018, 8:53 AM Pager: [phone] Consults: [phone] Mon-Fri 7:00 am-4:30 pm Sat-Sun 7:00 am-11:30 am

## 2018-02-22 ENCOUNTER — Inpatient Hospital Stay (HOSPITAL_COMMUNITY): Payer: BLUE CROSS/BLUE SHIELD

## 2018-02-22 LAB — BASIC METABOLIC PANEL
Anion gap: 9 (ref 5–15)
BUN: 10 mg/dL (ref 6–20)
CALCIUM: 8.5 mg/dL — AB (ref 8.9–10.3)
CO2: 23 mmol/L (ref 22–32)
Chloride: 104 mmol/L (ref 101–111)
Creatinine, Ser: 0.55 mg/dL (ref 0.44–1.00)
GFR calc Af Amer: >60 mL/min (ref >60)
GLUCOSE: 116 mg/dL — AB (ref 65–99)
Potassium: 4 mmol/L (ref 3.5–5.1)
Sodium: 136 mmol/L (ref 135–145)

## 2018-02-22 LAB — CBC
HCT: 33.9 % — ABNORMAL LOW (ref 36.0–46.0)
Hemoglobin: 11.3 g/dL — ABNORMAL LOW (ref 12.0–15.0)
MCH: 31.2 pg (ref 26.0–34.0)
MCHC: 33.3 g/dL (ref 30.0–36.0)
MCV: 93.6 fL (ref 78.0–100.0)
PLATELETS: 98 10*3/uL — AB (ref 150–400)
RBC: 3.62 MIL/uL — ABNORMAL LOW (ref 3.87–5.11)
RDW: 14.1 % (ref 11.5–15.5)
WBC: 4.8 10*3/uL (ref 4.0–10.5)

## 2018-02-22 MED ORDER — METHOCARBAMOL 500 MG PO TABS: 500.0000 mg | ORAL_TABLET | Freq: Three times a day (TID) | ORAL | 0 refills | Status: AC | PRN

## 2018-02-22 MED ORDER — POLYETHYLENE GLYCOL 3350 17 G PO PACK: 17.0000 g | PACK | Freq: Every day | ORAL | 0 refills | Status: AC

## 2018-02-22 MED ORDER — OXYCODONE HCL 5 MG PO TABS: 5.0000 mg | ORAL_TABLET | Freq: Four times a day (QID) | ORAL | 0 refills | Status: AC | PRN

## 2018-02-22 MED ORDER — BISACODYL 10 MG RE SUPP
10.0000 mg | Freq: Once | RECTAL | Status: AC
  Administered 2018-02-22: 10 mg via RECTAL
  Filled 2018-02-22: qty 1

## 2018-02-22 MED ORDER — DOCUSATE SODIUM 100 MG PO CAPS: 100.0000 mg | ORAL_CAPSULE | Freq: Two times a day (BID) | ORAL | 0 refills | Status: AC

## 2018-02-22 MED ORDER — PANTOPRAZOLE SODIUM 40 MG PO TBEC: 40.0000 mg | DELAYED_RELEASE_TABLET | Freq: Every day | ORAL | 0 refills | Status: AC

## 2018-02-22 NOTE — Discharge Summary (Signed)
[facility] Discharge Summary   Patient ID: Suzanne KatosDonna Such MRN: [id number] DOB/AGE: [birth year] 62 y.o.  Admit date: 02/16/2018 Discharge date: 02/22/2018  Admitting Diagnosis: Ventral umbilical hernia  Discharge Diagnosis Patient Active Problem List   Diagnosis Date Noted  . S/P repair of ventral hernia April 2019 02/19/2018  . Ventral hernia without obstruction or gangrene 02/17/2018    Consultants None  Imaging: Dg Abd Portable 1v  Result Date: 02/22/2018 CLINICAL DATA:  Postop abdominal pain, distention EXAM: PORTABLE ABDOMEN - 1 VIEW COMPARISON:  CT 02/16/2018 FINDINGS: Moderate stool burden throughout the colon. Mild gaseous distention of lower abdominal small bowel loops, similar to prior CT. Gas noted within the common bile duct. No free air organomegaly. IMPRESSION: Moderate stool burden. Mild gaseous distention of lower abdominal small bowel loops, similar to prior CT. Pneumobilia. Electronically Signed   By: Charlett NoseKevin  Dover M.D.   On: 02/22/2018 10:44    Procedures Dr. Daphine DeutscherMartin (02/19/18) - Laparoscopically assisted repair of two ventral hernias with 11.4 cm circular Ventralex mesh secured with the absorbable tackers and 6 full thickness 0 Novafil sutures placed with PMI device  Hospital Course:  Suzanne KatosDonna Font is a 62yo female PMH h/o narcotic and alcohol abuse [facility], [facility] with nearly 1 week of abdominal pain associated with n/v.  She has had a known umbilical hernia for at least a couple years.  She has a history of surgery for ulcer disease on [place].  The pain persisted so she came to the emergency department.  A CT scan shows a ventral hernia with some surrounding edema suggesting incarceration. Hernia was easily reducible. She was admitted for bowel rest and once bowel edema improved she was taken to the operating room for laroscopically assisted repair of  two ventral hernias. Tolerated procedure well and was transferred to the floor.  Diet was advanced as tolerated.  It had been 1 week prior to surgery that she had had a bowel movement, so she was placed on a bowel regimen postoperatively. She did have a good bowel movement on 4/25. On POD3 the patient was voiding well, tolerating diet, ambulating well, pain well controlled, vital signs stable, incisions c/d/i and felt stable for discharge home with her aunt; she plans to [facility] once acute postsurgical pain improves.  Patient will follow up as below and knows to call with questions or concerns.    I have personally reviewed the patients medication history on the [place] controlled substance database.     Allergies as of 02/22/2018   No Known Allergies     Medication List    STOP taking these medications   HYDROcodone-acetaminophen 10-325 MG tablet Commonly known as:  NORCO     TAKE these medications   amphetamine-dextroamphetamine 30 MG tablet Commonly known as:  ADDERALL Take 30 mg by mouth daily.   carvedilol 3.125 MG tablet Commonly known as:  COREG Take 3.125 mg by mouth daily.   docusate sodium 100 MG capsule Commonly known as:  COLACE Take 1 capsule (100 mg total) by mouth 2 (two) times daily.   hydrOXYzine 25 MG tablet Commonly known as:  ATARAX/VISTARIL Take 25 mg by mouth every 6 (six) hours as needed for anxiety or itching.   isosorbide mononitrate 30 MG 24 hr tablet Commonly known as:  IMDUR Take 30 mg by mouth daily.   lactulose 10 GM/15ML solution Commonly known  as:  CHRONULAC Take 10 g by mouth 2 (two) times daily as needed for mild constipation.   methocarbamol 500 MG tablet Commonly known as:  ROBAXIN Take 1 tablet (500 mg total) by mouth every 8 (eight) hours as needed for muscle spasms.   oxyCODONE 5 MG immediate release tablet Commonly known as:  Oxy IR/ROXICODONE Take 1 tablet (5 mg total) by mouth every 6 (six) hours as needed for  severe pain.   pantoprazole 40 MG tablet Commonly known as:  PROTONIX Take 1 tablet (40 mg total) by mouth daily. Start taking on:  02/23/2018   polyethylene glycol packet Commonly known as:  MIRALAX / GLYCOLAX Take 17 g by mouth daily for 15 days.        Follow-up Information    Dr. Luretha Murphy. Go on 03/02/2018.   Why:  Your appointment is 03/02/18 at 4:30PM. Please arrive 30 minutes prior to your appointment to check in and fill out paperwork. Bring photo ID and insurance information. Contact information: [facility] [address] [place], [place]   Telephone #: [phone]       Estanislado Pandy. Call in 1 week(s).   Why:  Call to arrange follow up with your primary care physician regarding post-hospitalization followup, medication management, and to disucss antidepressants Contact information: [facility]  [phone]          Signed: Franne Forts, [facility] 02/22/2018, 1:44 PM Pager: [phone] Consults: [phone] Mon-Fri 7:00 am-4:30 pm Sat-Sun 7:00 am-11:30 am

## 2018-02-22 NOTE — Progress Notes (Signed)
[facility] Progress Note  3 Days Post-Op  Subjective: CC- abdominal pain Patient states that she continues to have right sided abdominal pain, no better or worse than yesterday. Denies n/v. Passing flatus but still has not had a BM in over a week. She did not take the dulcolax suppository yesterday.   Objective: Vital signs in last 24 hours: Temp:  [98.3 F (36.8 C)-98.7 F (37.1 C)] 98.7 F (37.1 C) (04/25 0531) Pulse Rate:  [79-81] 80 (04/25 0531) Resp:  [16-17] 16 (04/25 0531) BP: (120-180)/(71-92) 180/92 (04/25 0531) SpO2:  [98 %-99 %] 98 % (04/25 0531) Last BM Date: 02/13/18  Intake/Output from previous day: 04/24 0701 - 04/25 0700 In: 960 [P.O.:960] Out: -  Intake/Output this shift: No intake/output data recorded.  PE: Gen: Alert, NAD HEENT: EOM's intact, pupils equal and round Card: RRR, no M/G/R heard Pulm: CTAB, no W/R/R, effort normal Abd: Soft, mild distension, TTP right hemiabdomen without rebound,+BS,midline incision cdi with staples intact and honeycomb dressing in place, few lap incisions cdi with single staple intact [id number] soft and nontender  Lab Results:  Recent Labs    02/20/18 0718 02/21/18 0359  WBC 7.2 6.3  HGB 11.8* 10.9*  HCT 35.9* 32.9*  PLT 104* 105*   BMET Recent Labs    02/20/18 0718 02/21/18 0359  NA 134* 138  K 4.3 4.5  CL 100* 105  CO2 25 25  GLUCOSE 171* 110*  BUN 8 8  CREATININE 0.49 0.49  CALCIUM 8.8* 8.3*   PT/INR Recent Labs    02/19/18 1325  LABPROT 15.2  INR 1.21   CMP     Component Value Date/Time   NA 138 02/21/2018 0359   K 4.5 02/21/2018 0359   CL 105 02/21/2018 0359   CO2 25 02/21/2018 0359   GLUCOSE 110 (H) 02/21/2018 0359   BUN 8 02/21/2018 0359   CREATININE 0.49 02/21/2018 0359   CALCIUM 8.3 (L) 02/21/2018 0359   PROT 5.9 (L) 02/19/2018 0728   ALBUMIN 2.9 (L) 02/19/2018 0728   AST 32 02/19/2018 0728   ALT 22 02/19/2018 0728   ALKPHOS 95 02/19/2018 0728   BILITOT  1.0 02/19/2018 0728   GFRNONAA >60 02/21/2018 0359   GFRAA >60 02/21/2018 0359   Lipase     Component Value Date/Time   LIPASE 31 02/16/2018 1734       Studies/Results: No results found.  Anti-infectives: Anti-infectives (From admission, onward)   Start     Dose/Rate Route Frequency Ordered Stop   02/20/18 0600  cefoTEtan (CEFOTAN) 2 g in sodium chloride 0.9 % 100 mL IVPB     2 g 200 mL/hr over 30 Minutes Intravenous On call to O.R. 02/19/18 1253 02/19/18 1546   02/19/18 1339  cefoTEtan in Dextrose 5% (CEFOTAN) 2-2.08 GM-%(50ML) IVPB    Note to Pharmacy:  Montel Clock   : cabinet override      02/19/18 1339 02/20/18 0144   02/17/18 0100  piperacillin-tazobactam (ZOSYN) IVPB 3.375 g     3.375 g 100 mL/hr over 30 Minutes Intravenous NOW 02/17/18 0050 02/17/18 0127       Assessment/Plan Heart murmur - worked up outpatient and cleared forelective hernia repair Anxiety  HTN Cirrhosis H/o narcoticand alcoholabuse - recent admission at [facility] ADHD  Hx of incarcerated ventral hernia S/pLaparoscopically assisted repair of two ventral hernias with 11.4 cm circular Ventralex mesh secured with the absorbable tackers and 6 full thickness 0 Novafil sutures placed with PMI device4/22 Dr. Daphine Deutscher -  POD 3 - Patient is passing gas and tolerating diet but still has not had a BM >1 week. Check abdominal xray and labs. Give dulcolax suppository. Needs to ambulate more. Will recheck patient later today.  ID -none currently FEN - regular diet VTE -SCDs, lovenox Foley -none Follow up -Dr. Daphine DeutscherMartin   LOS: 5 days    Franne FortsBrooke A Ajahni Nay , [facility] 02/22/2018, 9:52 AM Pager: [phone] Consults: [phone] Mon-Fri 7:00 am-4:30 pm Sat-Sun 7:00 am-11:30 am

## 2018-02-22 NOTE — Progress Notes (Signed)
Patient discharged to home with family, discharge instructions reviewed with patient who verbalized understanding. New RX's given to patient. 

## 2019-01-07 IMAGING — DX DG ABD PORTABLE 1V
2 series · 2 of 2 positions shown · non-contrast
Comparison: CT 02/16/2018

CLINICAL DATA: Postop abdominal pain, distention

EXAM:
PORTABLE ABDOMEN - 1 VIEW

[abdomen kub (1 of 2)]
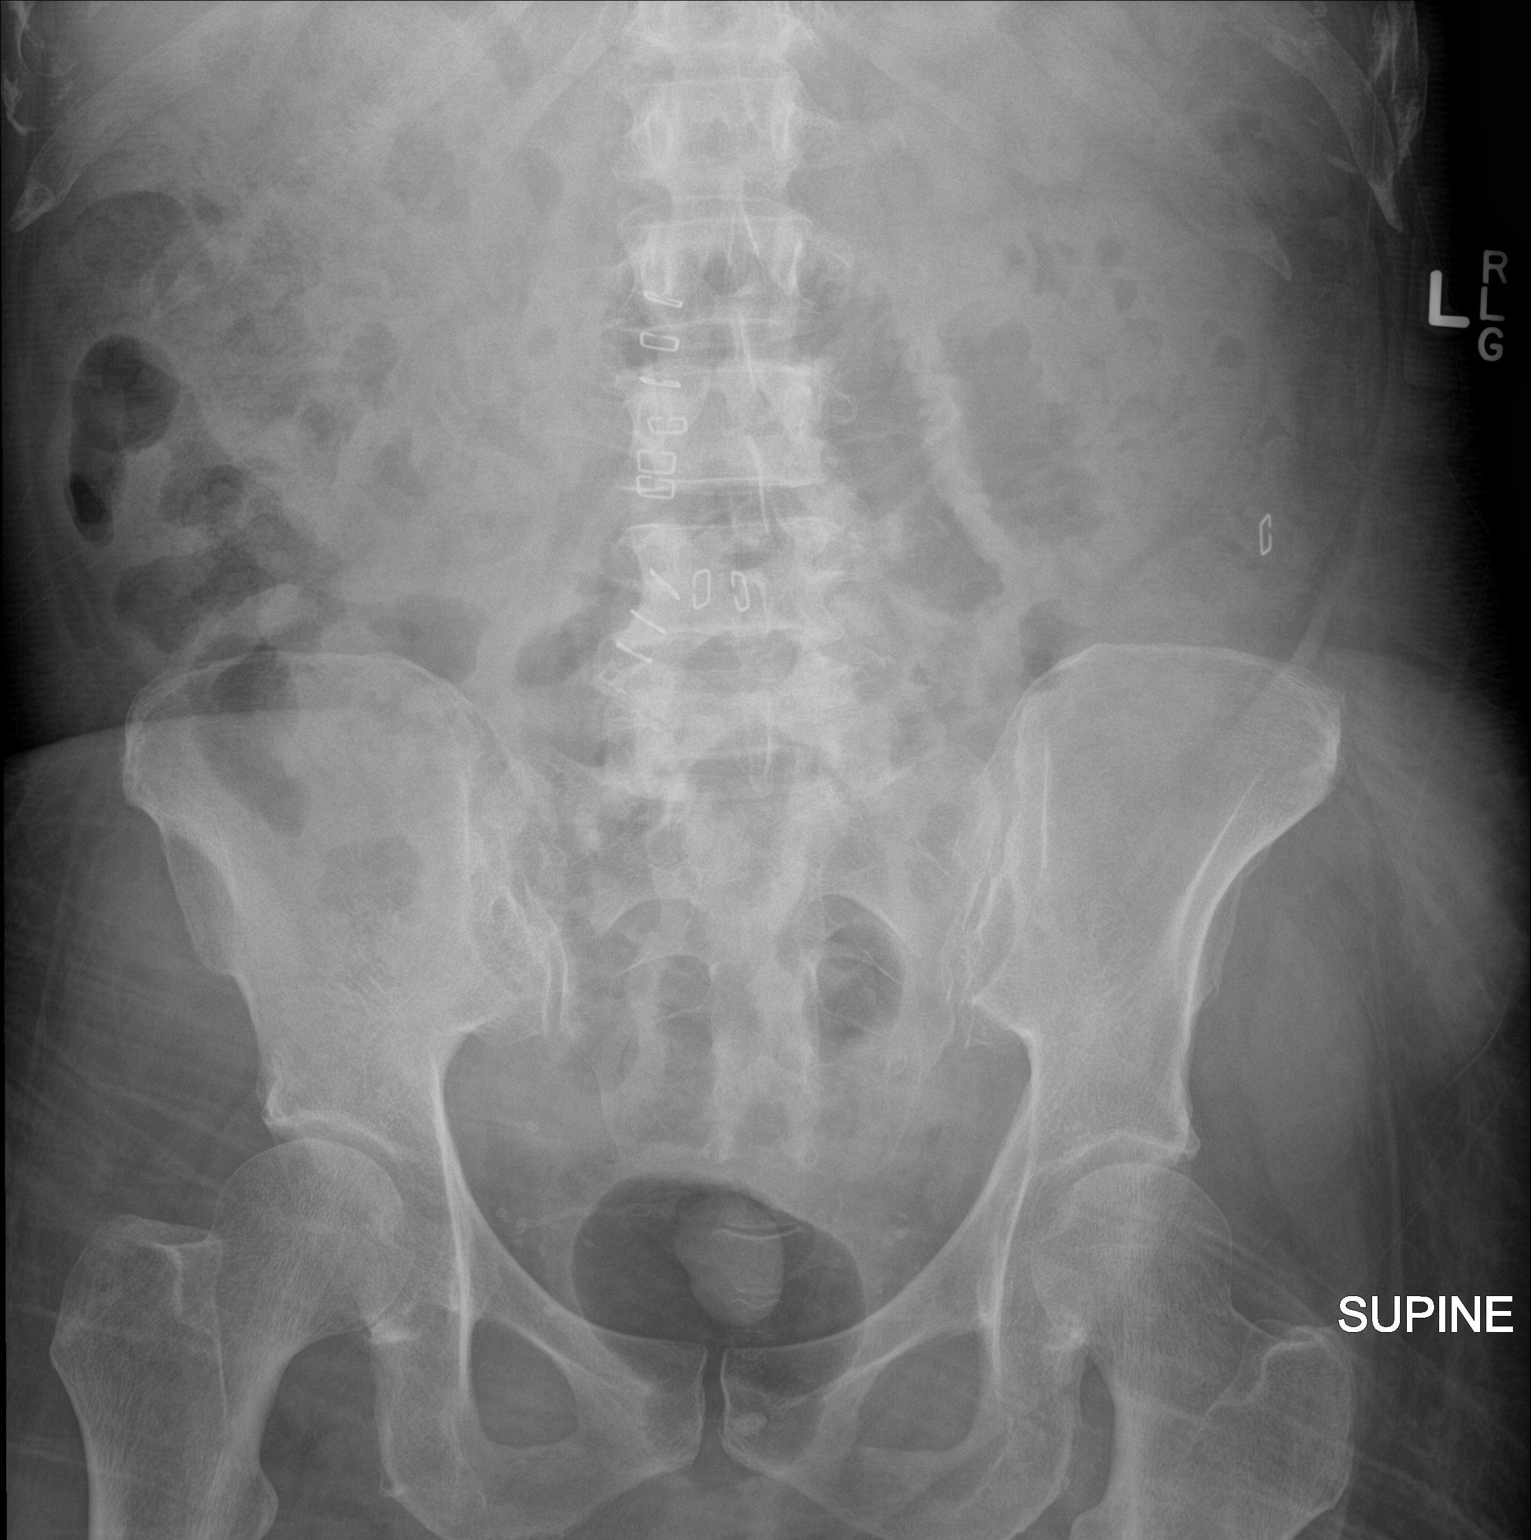

[abdomen kub (2 of 2)]
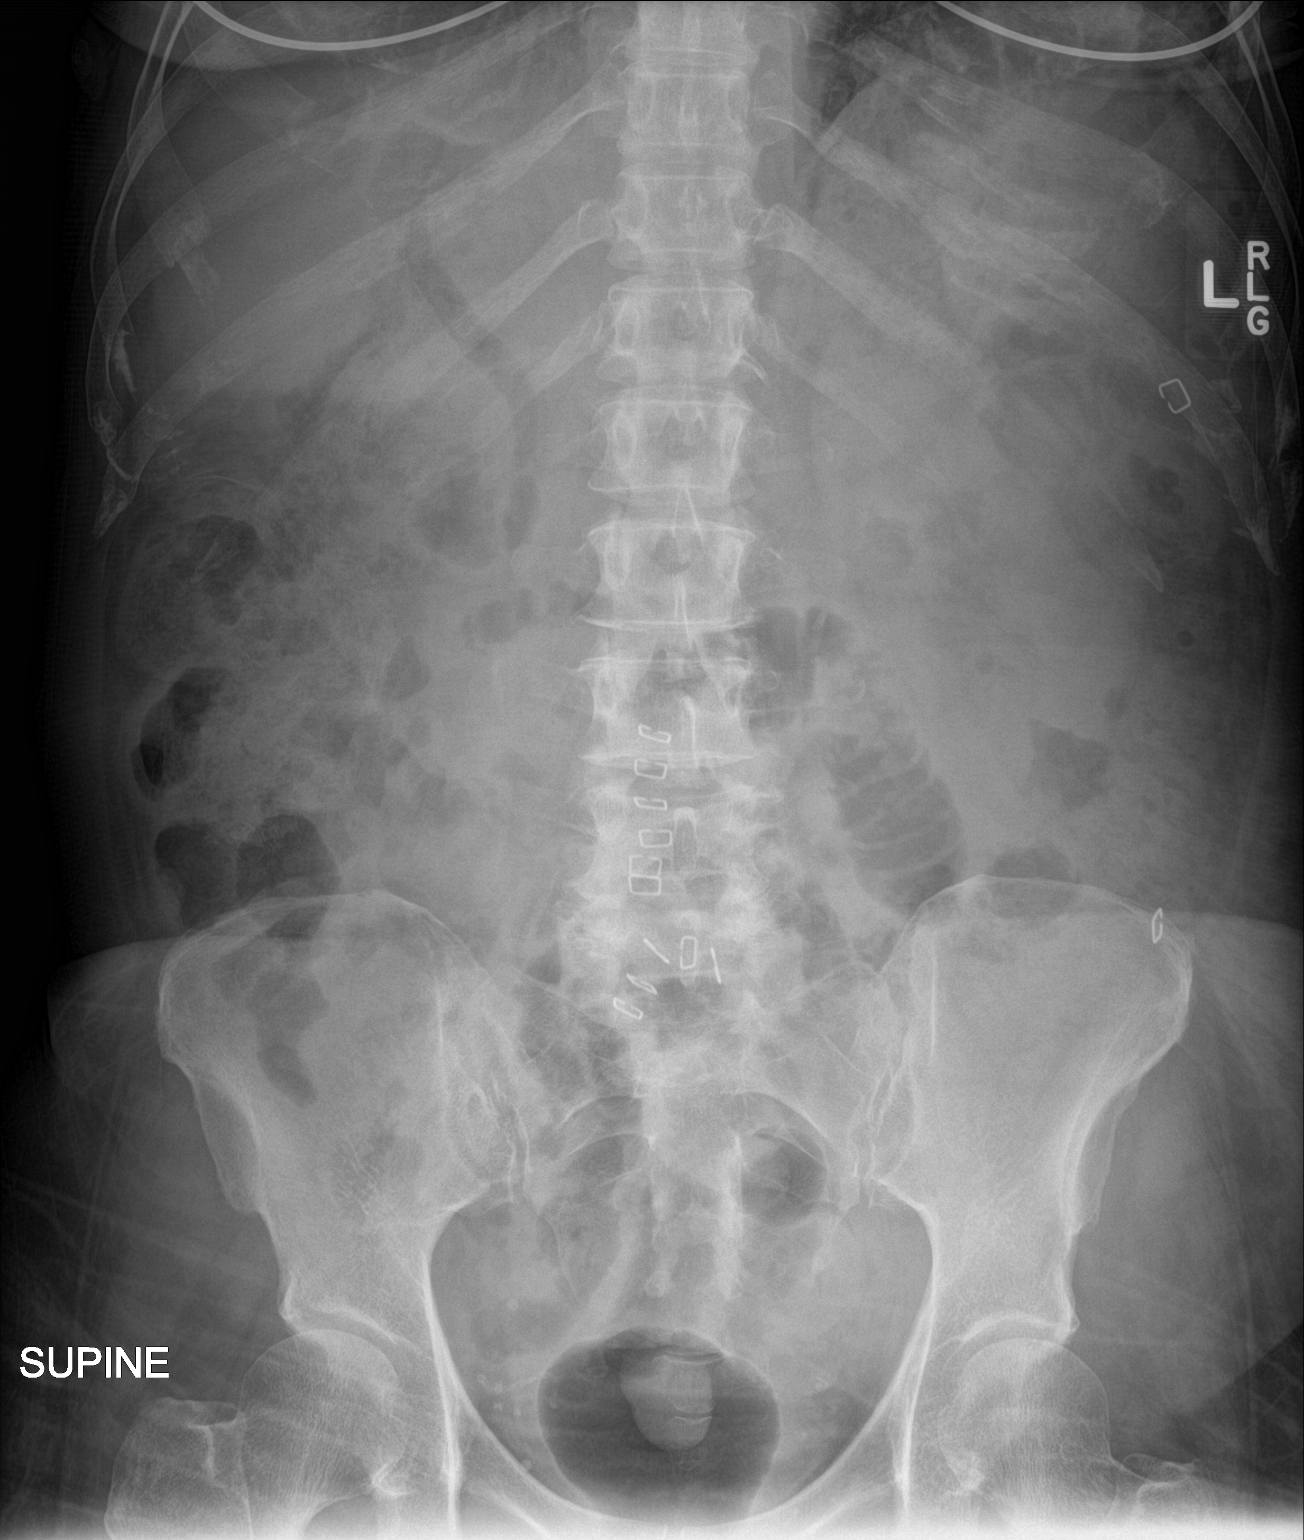

[2 of 2 positions shown; findings below may reference images not displayed]

FINDINGS: Moderate stool burden throughout the colon. Mild gaseous distention
of lower abdominal small bowel loops, similar to prior CT. Gas noted
within the common bile duct. No free air organomegaly.
IMPRESSION: Moderate stool burden. Mild gaseous distention of lower abdominal
small bowel loops, similar to prior CT.

Pneumobilia.

## 2022-07-31 DEATH — deceased
